# Patient Record
Sex: Female | Born: 1960 | Race: Black or African American | Hispanic: No | Marital: Married | Smoking: Never smoker
Health system: Southern US, Community
[De-identification: ages and names within clinical notes are randomized; demographics above are authoritative.]

## PROBLEM LIST (undated history)

## (undated) DIAGNOSIS — Z780 Asymptomatic menopausal state: Secondary | ICD-10-CM

## (undated) DIAGNOSIS — E559 Vitamin D deficiency, unspecified: Secondary | ICD-10-CM

## (undated) HISTORY — DX: Asymptomatic menopausal state: Z78.0

## (undated) HISTORY — PX: COLONOSCOPY: SHX174

## (undated) HISTORY — DX: Vitamin D deficiency, unspecified: E55.9

## (undated) HISTORY — PX: APPENDECTOMY: SHX54

---

## 1998-05-26 ENCOUNTER — Ambulatory Visit (HOSPITAL_COMMUNITY): Admission: RE | Admit: 1998-05-26 | Discharge: 1998-05-26 | Payer: Self-pay | Admitting: Family Medicine

## 1998-05-26 ENCOUNTER — Encounter: Payer: Self-pay | Admitting: Family Medicine

## 1998-08-13 ENCOUNTER — Encounter: Payer: Self-pay | Admitting: Family Medicine

## 1998-08-13 ENCOUNTER — Ambulatory Visit (HOSPITAL_COMMUNITY): Admission: RE | Admit: 1998-08-13 | Discharge: 1998-08-13 | Payer: Self-pay | Admitting: Family Medicine

## 2000-02-23 ENCOUNTER — Other Ambulatory Visit: Admission: RE | Admit: 2000-02-23 | Discharge: 2000-02-23 | Payer: Self-pay | Admitting: Obstetrics and Gynecology

## 2000-09-14 ENCOUNTER — Inpatient Hospital Stay (HOSPITAL_COMMUNITY): Admission: AD | Admit: 2000-09-14 | Discharge: 2000-09-19 | Payer: Self-pay | Admitting: *Deleted

## 2000-09-20 ENCOUNTER — Encounter: Admission: RE | Admit: 2000-09-20 | Discharge: 2000-11-01 | Payer: Self-pay | Admitting: Obstetrics and Gynecology

## 2000-10-28 ENCOUNTER — Other Ambulatory Visit: Admission: RE | Admit: 2000-10-28 | Discharge: 2000-10-28 | Payer: Self-pay | Admitting: Obstetrics and Gynecology

## 2003-10-22 ENCOUNTER — Other Ambulatory Visit: Admission: RE | Admit: 2003-10-22 | Discharge: 2003-10-22 | Payer: Self-pay | Admitting: Family Medicine

## 2004-11-02 ENCOUNTER — Ambulatory Visit (HOSPITAL_COMMUNITY): Admission: RE | Admit: 2004-11-02 | Discharge: 2004-11-02 | Payer: Self-pay | Admitting: Family Medicine

## 2004-11-02 ENCOUNTER — Other Ambulatory Visit: Admission: RE | Admit: 2004-11-02 | Discharge: 2004-11-02 | Payer: Self-pay | Admitting: Family Medicine

## 2008-09-02 ENCOUNTER — Encounter: Admission: RE | Admit: 2008-09-02 | Discharge: 2008-09-02 | Payer: Self-pay | Admitting: Family Medicine

## 2009-07-19 IMAGING — CR DG KNEE 1-2V*R*
2 series · 2 of 2 positions shown · non-contrast
Comparison: None

CLINICAL DATA: Knee pain, no acute trauma

RIGHT KNEE - 1-2 VIEW

[view not recorded (1 of 2)]
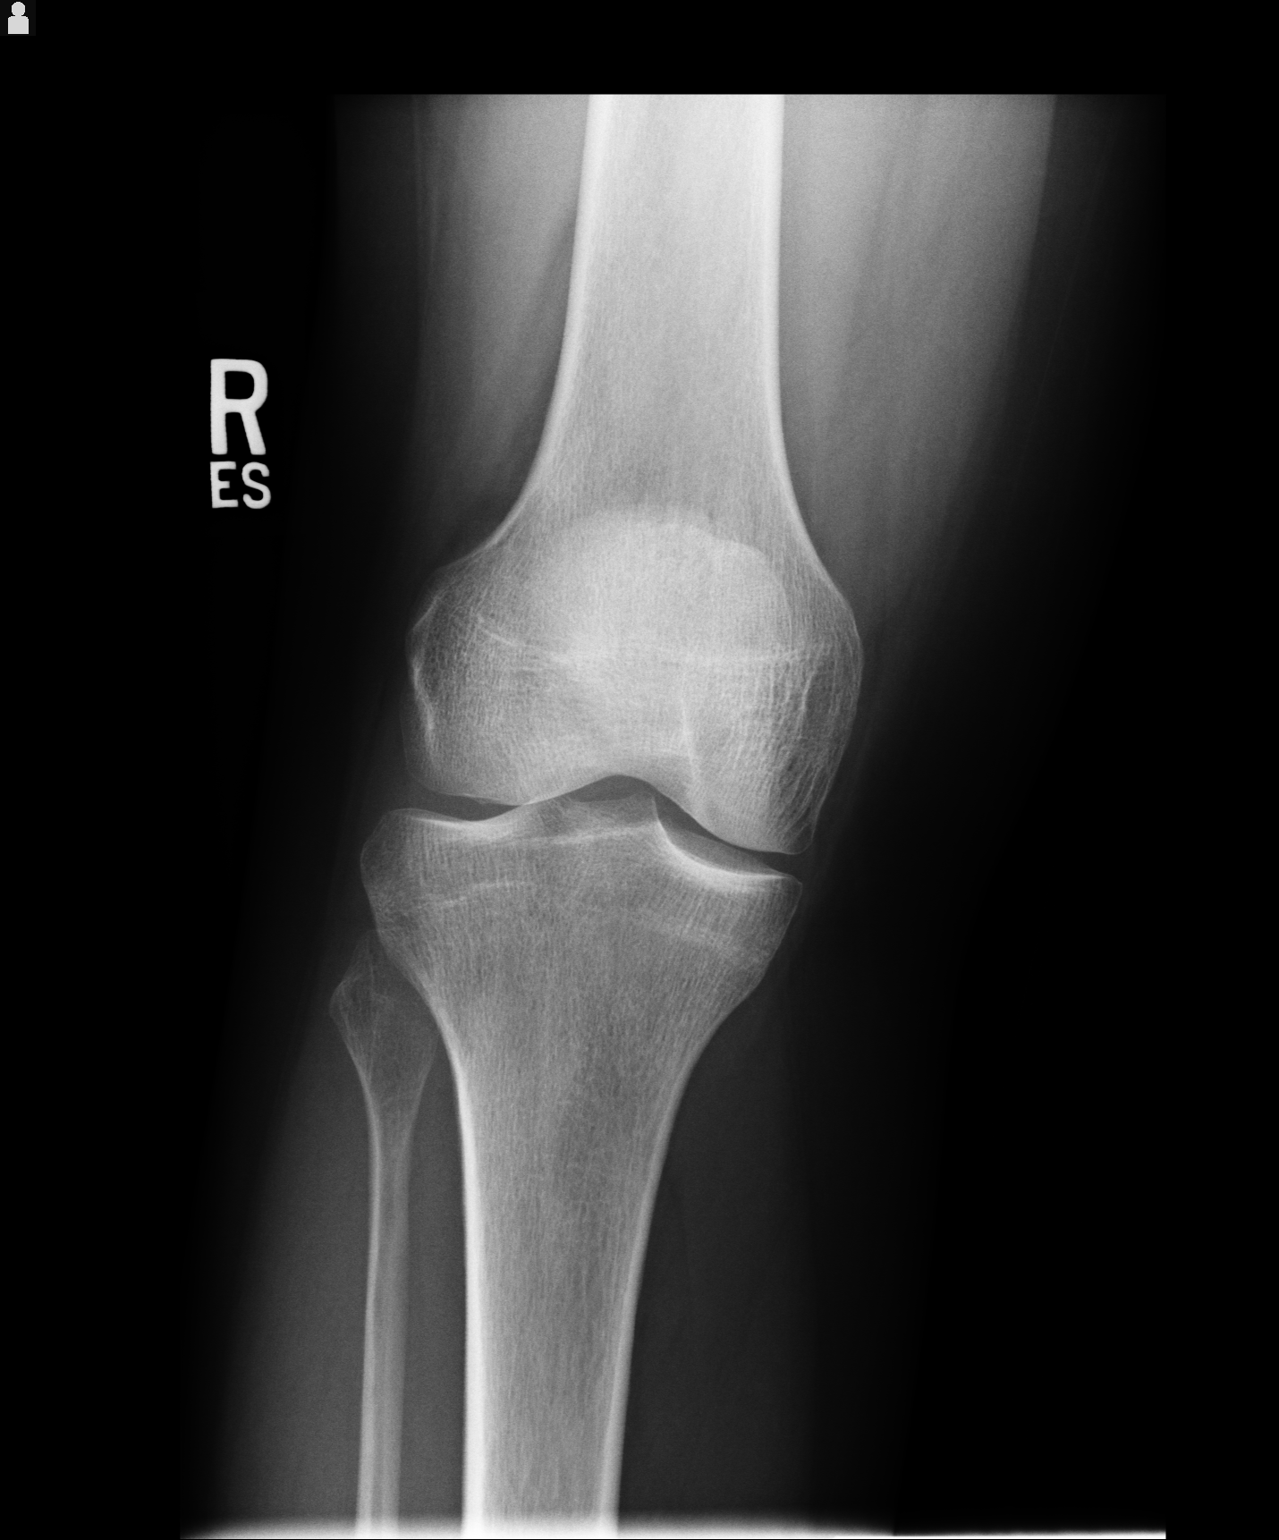

[view not recorded (2 of 2)]
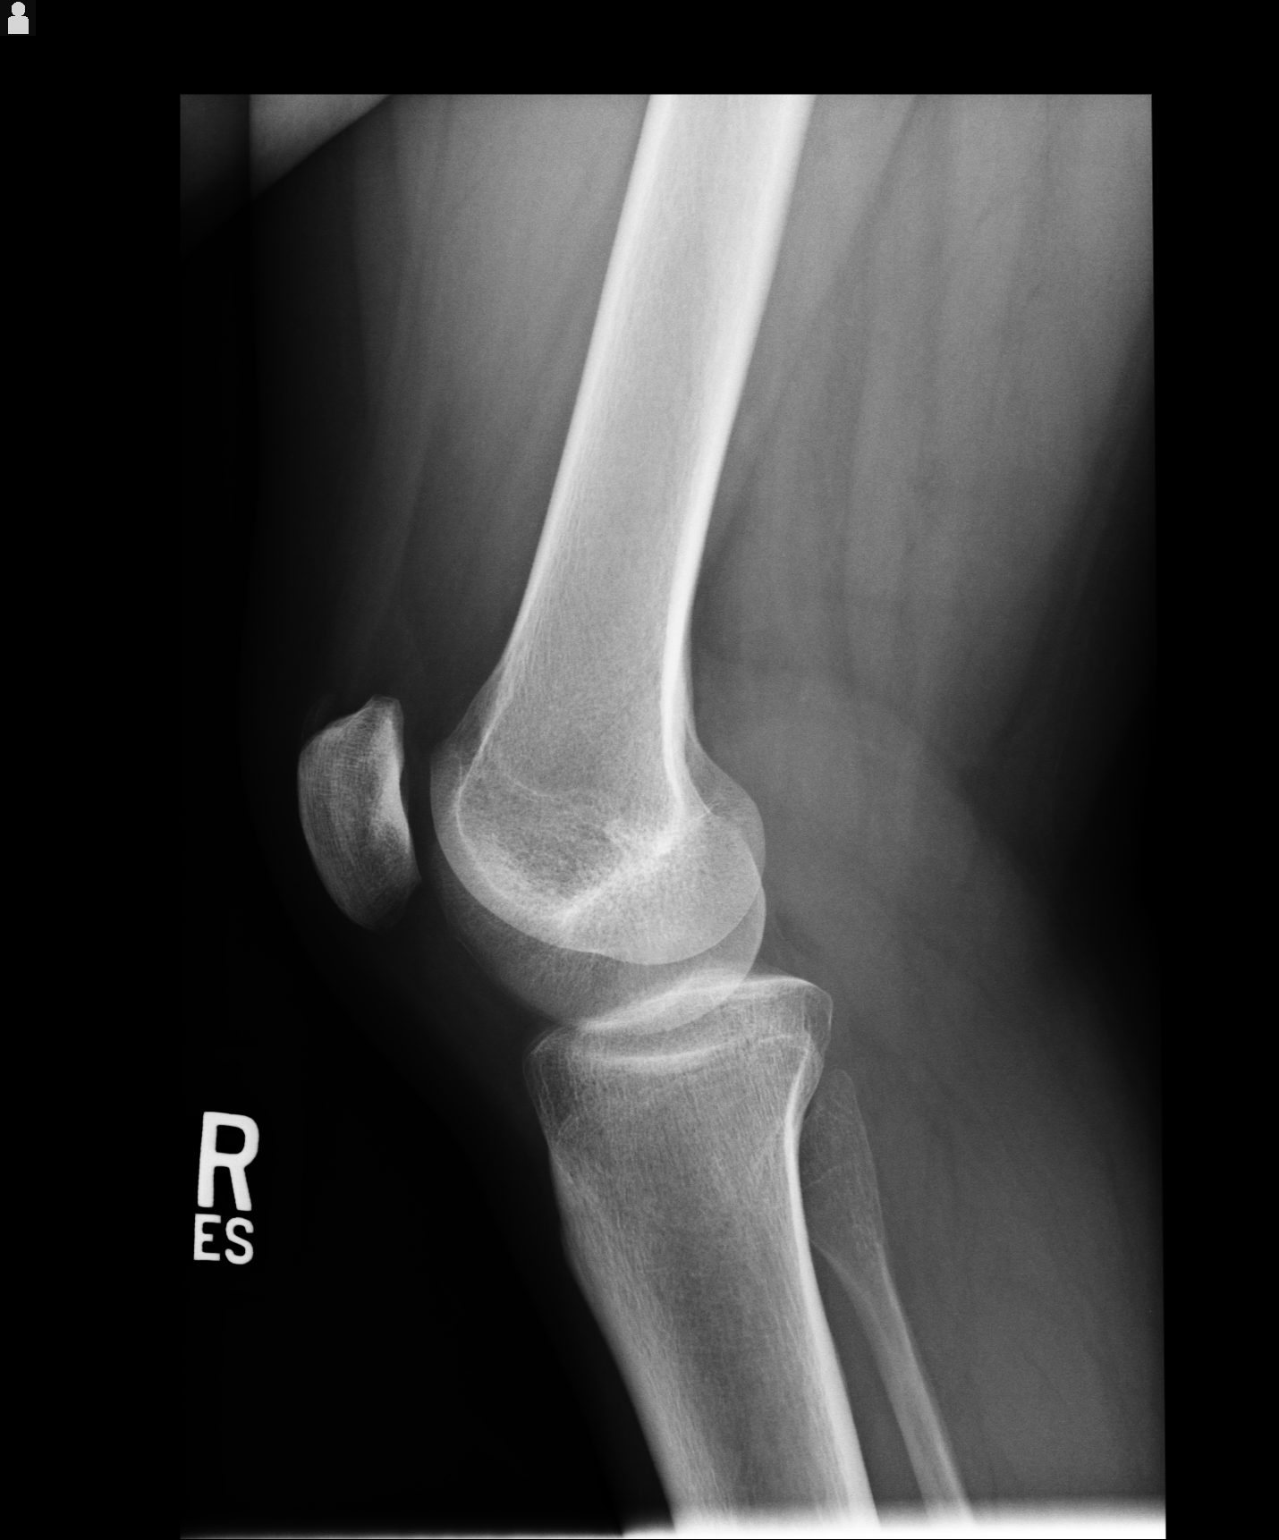

[2 of 2 positions shown; findings below may reference images not displayed]

FINDINGS: There is only minimal loss of medial joint space.  No
acute abnormality is seen and no effusion is noted.
IMPRESSION: Minimal loss of medial joint space.  No acute abnormality.

## 2009-08-29 ENCOUNTER — Other Ambulatory Visit: Admission: RE | Admit: 2009-08-29 | Discharge: 2009-08-29 | Payer: Self-pay | Admitting: Family Medicine

## 2010-11-20 NOTE — Op Note (Signed)
[facility]  Patient:    Rachel Zimmerman, Rachel Zimmerman                      MRN: [id number] Proc. Date: 09/16/00 Adm. Date:  [id number] Attending:  Donne Hazel                           Operative Report  PREOPERATIVE DIAGNOSES:       1. Intrauterine pregnancy at 40 weeks.                               2. Previous cesarean section.                               3. Failed induction.  POSTOPERATIVE DIAGNOSES:      1. Intrauterine pregnancy at 40 weeks.                               2. Previous cesarean section.                               3. Failed induction.  PROCEDURE:                    Repeat low transverse cesarean section.  SURGEON:                      Trevor Iha, M.D.  ANESTHESIA:                   Spinal.  ESTIMATED BLOOD LOSS:         800 cc.  INDICATIONS:                  The patient is a 50 year old G5, P1, A3 admitted at 39-5/7 weeks for elective induction of labor due to a history of C-section for nonreassuring fetal heart tones.  She underwent Prostaglandin induction, followed by Pitocin, followed by Prostaglandin, followed by Pitocin and, after no cervical change after two days of induction, proceeded with repeat cesarean section for failed induction.  The risks and benefits were discussed. Informed consent was obtained.  FINDINGS AT THE TIME OF SURGERY:              Viable female infant.  Apgars were 8 and 9; pH was 7.34.  Several intramural and submucosal fibroids were noted.  DESCRIPTION OF PROCEDURE:     After adequate analgesia, the patient was placed in the supine position with left lateral tilt.  She was sterilely prepped and draped.  The bladder was sterilely drained with a Foley catheter.  A Pfannenstiel skin incision was sharply incised.  The previous keloid scar was sharply removed.  The incision was taken down sharply to the fascia, which was incised transversely and extended superiorly and inferiorly down to  the bellies of the rectus muscles, which were separated sharply in the midline. The peritoneum was then entered sharply.  A bladder blade was placed.  The uterus was elevated, nicked and incised transversely.  A bladder flap was created and the bladder blade was replaced behind the bladder flap.  A low cervical myotomy incision was taken down to the amniotic sac.  Amniotomy was  performed.  It was extended laterally with the operators finger tips.  The vertex was delivered atraumatically with a vacuum extractor.  The nares and pharynx were suctioned.  The infant was then delivered and the cord clamped and cut.  The infant was then handed to the pediatricians for resuscitation. It was a viable female infant.  Apgars were 8 and 9.  Cord blood was then obtained.  The placenta was extracted manually.  The uterus was exteriorized and wiped clean with a dry lap.  The myotomy incision was closed in two layers, the first being a running locking layer of 0 Monocryl, the second being an imbricating layer with 0 Monocryl.  Good hemostasis was achieved. The uterus was placed back in the peritoneal cavity.  After a copious amount of irrigation and adequate hemostasis, the peritoneum was closed with 0 Monocryl.  The rectus muscle was plicated in the midline with 0 Monocryl.  The fascia was then closed with a single suture of 0 Panacryl in a running fashion.  After adequate hemostasis was assured and irrigation was applied, the skin was stapled and Steri-Strips were applied.  The patient tolerated the procedure well and was stable on transfer to the recovery room.  Sponge, needle and instrument counts were normal x 3.  The patient received 1 g of cefotetan after delivery of the placenta. DD:  09/16/00 TD:  09/16/00 Job: [id number]

## 2010-11-20 NOTE — Discharge Summary (Signed)
[facility]  Patient:    Rachel Zimmerman, Rachel Zimmerman                      MRN: [id number] Adm. Date:  [id number] Disc. Date: [id number] Attending:  Donne Hazel Dictator:   Danie Chandler, R.N.                           Discharge Summary  ADMISSION DIAGNOSIS:          Intrauterine pregnancy at term for elective induction of labor and vaginal birth after cesarean attempt.  DISCHARGE DIAGNOSES:          1. Intrauterine pregnancy at term for elective induction of labor and vaginal birth after cesarean attempt.                                2. Previous cesarean section and failed induction.  PROCEDURE:                    On September 16, 2000, repeat low transverse cesarean section.  REASON FOR ADMISSION:         The patient is a 50 year old gravida 5, para 1 who was admitted at 39-5/7 weeks for elective induction of labor due to a history of C-section for nonreassuring fetal heart tones. The patient underwent prostaglandin induction followed by Pitocin followed by prostaglandin followed by Pitocin and after no cervical change after two days of induction, the decision was made to proceed with repeat cesarean section for failed induction.  HOSPITAL COURSE:              The patient was taken to the operating room and underwent the above noted procedure without complications.  This was productive of a viable female infant with Apgars of 8 at one minute and 9 at five minutes and an arterial cord pH of 7.34.  Postoperatively, on day #1, the patients hemoglobin was 11.2, hematocrit 32.1 and white blood cell count 9.2. On postoperative day #2, the patient had a good return of bowel function and was tolerating a regular diet.  She was also ambulating well without difficulty and had good pain control.  She was discharged home on postoperative day #3.  CONDITION ON DISCHARGE:       Good.  DIET:                         Regular as tolerated.  ACTIVITY:                      No heavy lifting, no driving, no vaginal entry.  FOLLOW-UP:                    She is to follow up in the office in one to two weeks for incision check and she is to call for temperature greater than 100 degrees, persistent nausea or vomiting, heavy vaginal bleeding, and/or redness or drainage from the incision site.  DISCHARGE MEDICATIONS:        1. Prenatal vitamin one p.o. q.d.                               2. Tylox #25 one to two p.o. q.4-6h. p.r.n.  pain.                               3. Motrin 600 mg one every six hours as needed                                  for pain. DD:  10/04/00 TD:  10/04/00 Job: [id number]

## 2012-02-01 ENCOUNTER — Other Ambulatory Visit (HOSPITAL_COMMUNITY)
Admission: RE | Admit: 2012-02-01 | Discharge: 2012-02-01 | Disposition: A | Discharge status: Home / Self Care | Payer: 59 | Source: Ambulatory Visit | Attending: Family Medicine | Admitting: Family Medicine

## 2012-02-01 DIAGNOSIS — Z01419 Encounter for gynecological examination (general) (routine) without abnormal findings: Secondary | ICD-10-CM | POA: Insufficient documentation

## 2014-08-27 ENCOUNTER — Other Ambulatory Visit: Payer: Self-pay | Admitting: Family Medicine

## 2014-08-27 ENCOUNTER — Other Ambulatory Visit (HOSPITAL_COMMUNITY)
Admission: RE | Admit: 2014-08-27 | Discharge: 2014-08-27 | Disposition: A | Discharge status: Home / Self Care | Payer: 59 | Source: Ambulatory Visit | Attending: Family Medicine | Admitting: Family Medicine

## 2014-08-27 DIAGNOSIS — Z01419 Encounter for gynecological examination (general) (routine) without abnormal findings: Secondary | ICD-10-CM | POA: Diagnosis present

## 2014-08-28 LAB — CYTOLOGY - PAP

## 2015-03-03 ENCOUNTER — Encounter: Payer: Self-pay | Admitting: *Deleted

## 2015-03-05 ENCOUNTER — Ambulatory Visit (INDEPENDENT_AMBULATORY_CARE_PROVIDER_SITE_OTHER): Payer: 59 | Admitting: Neurology

## 2015-03-05 ENCOUNTER — Other Ambulatory Visit (INDEPENDENT_AMBULATORY_CARE_PROVIDER_SITE_OTHER): Payer: 59

## 2015-03-05 ENCOUNTER — Encounter: Payer: Self-pay | Admitting: Neurology

## 2015-03-05 VITALS — BP 124/80 | HR 62 | Ht 67.25 in | Wt 186.2 lb

## 2015-03-05 DIAGNOSIS — R202 Paresthesia of skin: Secondary | ICD-10-CM

## 2015-03-05 LAB — C-REACTIVE PROTEIN: CRP: 0.2 mg/dL — AB (ref 0.5–20.0)

## 2015-03-05 LAB — VITAMIN B12: Vitamin B-12: 600 pg/mL (ref 211–911)

## 2015-03-05 LAB — SEDIMENTATION RATE: SED RATE: 16 mm/h (ref 0–22)

## 2015-03-05 NOTE — Patient Instructions (Signed)
1.  Check blood work 2.  NCS/EMG of the right side 3.  Return to clinic after testing   [place] (EMG/NCS) INSTRUCTIONS  How to Prepare The neurologist conducting the EMG will need to know if you have certain medical conditions. Tell the neurologist and other EMG lab personnel if you: . Have a pacemaker or any other electrical medical device . Take blood-thinning medications . Have hemophilia, a blood-clotting disorder that causes prolonged bleeding Bathing Take a shower or bath shortly before your exam in order to remove oils from your skin. Don't apply lotions or creams before the exam.  What to Expect You'll likely be asked to change into a hospital gown for the procedure and lie down on an examination table. The following explanations can help you understand what will happen during the exam.  . Electrodes. The neurologist or a technician places surface electrodes at various locations on your skin depending on where you're experiencing symptoms. Or the neurologist may insert needle electrodes at different sites depending on your symptoms.  . Sensations. The electrodes will at times transmit a tiny electrical current that you may feel as a twinge or spasm. The needle electrode may cause discomfort or pain that usually ends shortly after the needle is removed. If you are concerned about discomfort or pain, you may want to talk to the neurologist about taking a short break during the exam.  . Instructions. During the needle EMG, the neurologist will assess whether there is any spontaneous electrical activity when the muscle is at rest - activity that isn't present in healthy muscle tissue - and the degree of activity when you slightly contract the muscle.  He or she will give you instructions on resting and contracting a muscle at appropriate times. Depending on what muscles and nerves the neurologist is examining, he or she may ask you to change positions  during the exam.  After your EMG You may experience some temporary, minor bruising where the needle electrode was inserted into your muscle. This bruising should fade within several days. If it persists, contact your primary care doctor.

## 2015-03-05 NOTE — Progress Notes (Signed)
Note routed

## 2015-03-05 NOTE — Progress Notes (Signed)
[facility] Clinic Note - Initial Visit   Date: 03/05/2015  Rachel Zimmerman MRN: [id number] DOB: 1961-06-25   Dear Dr. Nancy Fetter:  Thank you for your kind referral of Rachel Zimmerman for consultation of generalized numbness and tingling. Although her history is well known to you, please allow [place] to reiterate it for the purpose of our medical record. The patient was accompanied to the clinic by self.   History of Present Illness: Rachel Zimmerman is a 54 y.o. right-handed African [place] female with no prior medical history presenting for evaluation of generalized numbness/tingling.    For the past several years, she has noticed that her arms and legs fall asleep more than usual.  Her hands will fall asleep when she is talking on the phone, during sleep, and if she is laying on that side.  It quickly resolves if she repositions or tries to shake it off.  She had a fall in July 2016, she was taking a nap and woke up to go to the restroom and lost sensation of her right leg and fell.  She immediately started experiencing tingling sensation.  Her arms and legs fall asleep within about two minutes of any type of pressure, such as even resting her arm on the arm rest.    She also complains of a lot of joint pain (wrist, shoulders, knees, and hips).  She feels as if she is falling apart. She has previously seen a rheumatologist.  She complains of leg weakness and knee pain.    She feels that she has circulation problems because her legs would fall asleep even as a child.  Her PCP recommended ABIs but patient wanted to be evaluated prior to ordering studies so was referred here for further work-up.   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 Otherwise negative MR angiography of the intracranial  circulation.  1. Relatively minor changes of white matter disease in the bifrontal region of uncertain significance; considerations include early small vessel disease, complicated migraine or vasculitis.  2. Diffusion images negative for acute stroke.  3. No abnormal intracranial enhancement. No obvious cause is seen for the patient's exertional headaches.   Labs 08/27/2014:  TSH 0.49   Past Medical History  Diagnosis Date  . Vitamin D deficiency   . Menopause     Past Surgical History  Procedure Laterality Date  . Cesarean section      x 2  . Appendectomy       Medications:  Outpatient Encounter Prescriptions as of 03/05/2015  Medication Sig  . cholecalciferol (VITAMIN D) 1000 UNITS tablet Take 1,000 Units by mouth daily.  . COD LIVER OIL PO Take by mouth.  . Multiple Vitamins-Minerals (MULTIVITAMIN ADULT PO) Take by mouth.  . [DISCONTINUED] Magnesium 300 MG CAPS Take 2 capsules by mouth every evening.  . [DISCONTINUED] Pyridoxine HCl (VITAMIN B-6) 250 MG tablet Take 250 mg by mouth daily.   No facility-administered encounter medications on file as of 03/05/2015.     Allergies:  Allergies  Allergen Reactions  . Benadryl [Diphenhydramine Hcl (Sleep)]     Family History: Family History  Problem Relation Age of Onset  . Hypertension Father   . Kidney failure Father   . Diabetes Father   . Stroke Father   . Hypercholesterolemia Mother   . Myasthenia gravis Mother   .  Breast cancer Maternal Aunt     Social History: Social History  Substance Use Topics  . Smoking status: Never Smoker   . Smokeless tobacco: Never Used  . Alcohol Use: 0.0 oz/week    0 Standard drinks or equivalent per week   Social History   Social History Narrative   Married. Lives in a two story home.  2 sons.  Works as a Warden/ranger for [employer]  Education: college.    Review of Systems:  CONSTITUTIONAL: No fevers, chills, night sweats, or weight loss.   EYES: No visual changes  or eye pain ENT: No hearing changes.  No history of nose bleeds.   RESPIRATORY: No cough, wheezing and shortness of breath.   CARDIOVASCULAR: Negative for chest pain, and palpitations.   GI: Negative for abdominal discomfort, blood in stools or black stools.  No recent change in bowel habits.   GU:  No history of incontinence.   MUSCLOSKELETAL: + history of joint pain or swelling.  No myalgias.   SKIN: Negative for lesions, rash, and itching.   HEMATOLOGY/ONCOLOGY: Negative for prolonged bleeding, bruising easily, and swollen nodes.  No history of cancer.   ENDOCRINE: Negative for cold or heat intolerance, polydipsia or goiter.   PSYCH:  No depression or anxiety symptoms.   NEURO: As Above.   Vital Signs:  BP 124/80 mmHg  Pulse 62  Ht 5' 7.25" (1.708 m)  Wt 186 lb 4 oz (84.482 kg)  BMI 28.96 kg/m2  SpO2 99%   General Medical Exam:   General:  Well appearing, comfortable.   Eyes/ENT: see cranial nerve examination.   Neck: No masses appreciated.  Full range of motion without tenderness.  No carotid bruits. Respiratory:  Clear to auscultation, good air entry bilaterally.   Cardiac:  Regular rate and rhythm, no murmur.   Extremities:  No deformities, edema, or skin discoloration.  Skin:  No rashes or lesions.  Neurological Exam: MENTAL STATUS including orientation to time, place, person, recent and remote memory, attention span and concentration, language, and fund of knowledge is normal.  Speech is not dysarthric.  CRANIAL NERVES: II:  No visual field defects.  Unremarkable fundi.   III-IV-VI: Pupils equal round and reactive to light.  Normal conjugate, extra-ocular eye movements in all directions of gaze.  No nystagmus.  No ptosis.   V:  Normal facial sensation.    VII:  Normal facial symmetry and movements.  No pathologic facial reflexes.  VIII:  Normal hearing and vestibular function.   IX-X:  Normal palatal movement.   XI:  Normal shoulder shrug and head rotation.   XII:   Normal tongue strength and range of motion, no deviation or fasciculation.  MOTOR:  No atrophy, fasciculations or abnormal movements.  No pronator drift.  Tone is normal.    Right Upper Extremity:    Left Upper Extremity:    Deltoid  5/5   Deltoid  5/5   Biceps  5/5   Biceps  5/5   Triceps  5/5   Triceps  5/5   Wrist extensors  5/5   Wrist extensors  5/5   Wrist flexors  5/5   Wrist flexors  5/5   Finger extensors  5/5   Finger extensors  5/5   Finger flexors  5/5   Finger flexors  5/5   Dorsal interossei  5/5   Dorsal interossei  5/5   Abductor pollicis  5/5   Abductor pollicis  5/5   Tone (Ashworth  scale)  0  Tone (Ashworth scale)  0   Right Lower Extremity:    Left Lower Extremity:    Hip flexors  5/5   Hip flexors  5/5   Hip extensors  5/5   Hip extensors  5/5   Knee flexors  5/5   Knee flexors  5/5   Knee extensors  5/5   Knee extensors  5/5   Dorsiflexors  5/5   Dorsiflexors  5/5   Plantarflexors  5/5   Plantarflexors  5/5   Toe extensors  5/5   Toe extensors  5/5   Toe flexors  5/5   Toe flexors  5/5   Tone (Ashworth scale)  0  Tone (Ashworth scale)  0   MSRs:  Right                                                                 Left brachioradialis 2+  brachioradialis 2+  biceps 2+  biceps 2+  triceps 2+  triceps 2+  patellar 2+  patellar 2+  ankle jerk 2+  ankle jerk 2+  Hoffman no  Hoffman no  plantar response down  plantar response down   SENSORY:  Normal and symmetric perception of light touch, pinprick, vibration, and proprioception.  Romberg's sign absent.   COORDINATION/GAIT: Normal finger-to- nose-finger and heel-to-shin.  Intact rapid alternating movements bilaterally.  Able to rise from a chair without using arms.  Gait narrow based and stable. Tandem and stressed gait intact.    IMPRESSION: Mrs. Sorey is a 54 year-old female presenting for evaluation of intermittent generalized paresthesias of the arms and legs. Her exam is entirely normal and  non-focal. Nerves are not palpable on exam. 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  I will check for treatable causes of neuropathy and also proceed with NCS/EMG to help localize her symptoms.  Encouraged to her avoid hyperflexion/extension of the arm and wrist to see if this helps in the meantime.    PLAN/RECOMMENDATIONS:  1.  Check ESR, CRP, ANA, B12, copper 2.  NCS/EMG of the right arm and leg 3.  Return to clinic in 2 months.   The duration of this appointment visit was 40 minutes of face-to-face time with the patient.  Greater than 50% of this time was spent in counseling, explanation of diagnosis, planning of further management, and coordination of care.   Thank you for allowing me to participate in patient's care.  If I can answer any additional questions, I would be pleased to do so.    Sincerely,    Analisse Randle K. Posey Pronto, DO

## 2015-03-06 LAB — ANA: ANA: NEGATIVE

## 2015-03-08 LAB — COPPER, SERUM: Copper: 92 ug/dL (ref 70–175)

## 2015-03-18 ENCOUNTER — Ambulatory Visit (INDEPENDENT_AMBULATORY_CARE_PROVIDER_SITE_OTHER): Payer: 59 | Admitting: Neurology

## 2015-03-18 DIAGNOSIS — R202 Paresthesia of skin: Secondary | ICD-10-CM

## 2015-03-18 NOTE — Procedures (Signed)
[facility]  [place], [place]  [place], [address] [address] Tel: [phone] Fax:  [phone] Test Date:  03/18/2015  Patient: Rachel Zimmerman DOB: Apr 06, 1961 Physician: Narda Amber  Sex: Female Height: 5\' 7"  Ref Phys: Narda Amber, D.O.  ID#: [id number] Temp: 34.6C Technician: Jerilynn Mages. Dean   Patient Complaints: This is a 54 year old female presenting for evaluation of generalized paresthesias of her extremities.  NCV & EMG Findings: Extensive electrodiagnostic testing of the right upper and lower extremity shows:  1. Right median, ulnar, radial, and palmar sensory responses are within normal limits.  2. Right median and ulnar motor responses are within normal limits.  3. Right sural and superficial peroneal sensory responses are within normal limits.  4. Right peroneal and tibial motor responses are within normal limits.  5. There is no evidence of active or chronic motor axon loss changes affecting any of the tested muscles. Motor unit configuration and recruitment pattern is within normal limits.   Impression: This is a normal study of the right upper and lower extremities. In particular, there is no evidence of a generalized sensorimotor polyneuropathy, cervical/lumbosacral radiculopathy, or carpal tunnel syndrome affecting the right side.   ___________________________ Narda Amber, DO    Nerve Conduction Studies Anti Sensory Summary Table   Stim Site NR Peak (ms) Norm Peak (ms) P-T Amp (V) Norm P-T Amp  Right Median Anti Sensory (2nd Digit)  Wrist    2.6 <3.6 51.0 >15  Right Radial Anti Sensory (Base 1st Digit)  Wrist    2.0 <2.7 49.0 >14  Right Sup Peroneal Anti Sensory (Ant Lat Mall)  12 cm    3.5 <4.6 20.2 >4  Right Sural Anti Sensory (Lat Mall)  Calf    3.5 <4.6 15.6 >4  Right Ulnar Anti Sensory (5th Digit)  Wrist    2.7 <3.1 56.4 >10   Motor Summary Table   Stim Site NR Onset (ms) Norm Onset (ms) O-P Amp (mV) Norm O-P Amp Site1 Site2 Delta-0  (ms) Dist (cm) Vel (m/s) Norm Vel (m/s)  Right Median Motor (Abd Poll Brev)  Wrist    3.0 <4.0 8.9 >6 Elbow Wrist 4.1 25.0 61 >50  Elbow    7.1  8.4         Right Peroneal Motor (Ext Dig Brev)  Ankle    2.8 <6.0 5.6 >2.5 B Fib Ankle 7.4 35.0 47 >40  B Fib    10.2  5.6  Poplt B Fib 1.7 10.0 59 >40  Poplt    11.9  5.4         Right Tibial Motor (Abd Hall Brev)  Ankle    2.8 <6.0 6.1 >4 Knee Ankle 9.8 44.5 45 >40  Knee    12.6  4.7         Right Ulnar Motor (Abd Dig Minimi)  Wrist    2.8 <3.1 7.1 >7 B Elbow Wrist 3.8 23.0 61 >50  B Elbow    6.6  6.8  A Elbow B Elbow 1.5 10.0 67 >50  A Elbow    8.1  6.2          Comparison Summary Table   Stim Site NR Peak (ms) Norm Peak (ms) P-T Amp (V) Site1 Site2 Delta-P (ms) Norm Delta (ms)  Right Median/Ulnar Palm Comparison (Wrist - 8cm)  Median Palm    1.7 <2.2 70.4 Median Palm Ulnar Palm 0.1   Ulnar Palm    1.6 <2.2 25.0  F Wave Studies   NR F-Lat (ms) Lat Norm (ms) L-R F-Lat (ms)  Right Ulnar (Mrkrs) (Abd Dig Min)     28.03 <33    H Reflex Studies   NR H-Lat (ms) Lat Norm (ms) L-R H-Lat (ms)  Right Tibial (Gastroc)     26.67 <35    EMG   Side Muscle Ins Act Fibs Psw Fasc Number Recrt Dur Dur. Amp Amp. Poly Poly. Comment  Right AntTibialis Nml Nml Nml Nml Nml Nml Nml Nml Nml Nml Nml Nml N/A  Right 1stDorInt Nml Nml Nml Nml Nml Nml Nml Nml Nml Nml Nml Nml N/A  Right Ext Indicis Nml Nml Nml Nml Nml Nml Nml Nml Nml Nml Nml Nml N/A  Right PronatorTeres Nml Nml Nml Nml Nml Nml Nml Nml Nml Nml Nml Nml N/A  Right Biceps Nml Nml Nml Nml Nml Nml Nml Nml Nml Nml Nml Nml N/A  Right Triceps Nml Nml Nml Nml Nml Nml Nml Nml Nml Nml Nml Nml N/A  Right Deltoid Nml Nml Nml Nml Nml Nml Nml Nml Nml Nml Nml Nml N/A  Right Gastroc Nml Nml Nml Nml Nml Nml Nml Nml Nml Nml Nml Nml N/A  Right Flex Dig Long Nml Nml Nml Nml Nml Nml Nml Nml Nml Nml Nml Nml N/A  Right RectFemoris Nml Nml Nml Nml Nml Nml Nml Nml Nml Nml Nml Nml N/A  Right GluteusMed Nml  Nml Nml Nml Nml Nml Nml Nml Nml Nml Nml Nml N/A  Right BicepsFemS Nml Nml Nml Nml Nml Nml Nml Nml Nml Nml Nml Nml N/A      Waveforms:

## 2016-07-28 ENCOUNTER — Other Ambulatory Visit: Payer: Self-pay | Admitting: Obstetrics and Gynecology

## 2016-08-31 ENCOUNTER — Other Ambulatory Visit: Payer: Self-pay | Admitting: Family Medicine

## 2016-08-31 ENCOUNTER — Ambulatory Visit
Admission: RE | Admit: 2016-08-31 | Discharge: 2016-08-31 | Disposition: A | Discharge status: Home / Self Care | Payer: BLUE CROSS/BLUE SHIELD | Source: Ambulatory Visit | Attending: Family Medicine | Admitting: Family Medicine

## 2016-08-31 DIAGNOSIS — M25572 Pain in left ankle and joints of left foot: Secondary | ICD-10-CM

## 2016-09-03 ENCOUNTER — Encounter (HOSPITAL_COMMUNITY): Payer: Self-pay

## 2016-09-15 ENCOUNTER — Ambulatory Visit (HOSPITAL_COMMUNITY)
Admission: RE | Admit: 2016-09-15 | Discharge: 2016-09-15 | Disposition: A | Discharge status: Home / Self Care | Payer: BLUE CROSS/BLUE SHIELD | Source: Ambulatory Visit | Attending: Obstetrics and Gynecology | Admitting: Obstetrics and Gynecology

## 2016-09-15 ENCOUNTER — Ambulatory Visit (HOSPITAL_COMMUNITY): Payer: BLUE CROSS/BLUE SHIELD | Admitting: Certified Registered Nurse Anesthetist

## 2016-09-15 ENCOUNTER — Encounter (HOSPITAL_COMMUNITY)
Admission: RE | Disposition: A | Discharge status: Home / Self Care | Payer: Self-pay | Source: Ambulatory Visit | Attending: Obstetrics and Gynecology

## 2016-09-15 ENCOUNTER — Ambulatory Visit (HOSPITAL_COMMUNITY): Payer: BLUE CROSS/BLUE SHIELD

## 2016-09-15 ENCOUNTER — Encounter (HOSPITAL_COMMUNITY): Payer: Self-pay | Admitting: Certified Registered Nurse Anesthetist

## 2016-09-15 DIAGNOSIS — E559 Vitamin D deficiency, unspecified: Secondary | ICD-10-CM | POA: Insufficient documentation

## 2016-09-15 DIAGNOSIS — N950 Postmenopausal bleeding: Secondary | ICD-10-CM | POA: Diagnosis not present

## 2016-09-15 DIAGNOSIS — R58 Hemorrhage, not elsewhere classified: Secondary | ICD-10-CM

## 2016-09-15 DIAGNOSIS — Z79899 Other long term (current) drug therapy: Secondary | ICD-10-CM | POA: Diagnosis not present

## 2016-09-15 DIAGNOSIS — D250 Submucous leiomyoma of uterus: Secondary | ICD-10-CM | POA: Diagnosis not present

## 2016-09-15 HISTORY — PX: OPERATIVE ULTRASOUND: SHX5996

## 2016-09-15 HISTORY — PX: DILATATION & CURETTAGE/HYSTEROSCOPY WITH MYOSURE: SHX6511

## 2016-09-15 LAB — CBC
HCT: 34.1 % — ABNORMAL LOW (ref 36.0–46.0)
HEMOGLOBIN: 11.5 g/dL — AB (ref 12.0–15.0)
MCH: 30.8 pg (ref 26.0–34.0)
MCHC: 33.7 g/dL (ref 30.0–36.0)
MCV: 91.4 fL (ref 78.0–100.0)
Platelets: 253 10*3/uL (ref 150–400)
RBC: 3.73 MIL/uL — AB (ref 3.87–5.11)
RDW: 14 % (ref 11.5–15.5)
WBC: 5.2 10*3/uL (ref 4.0–10.5)

## 2016-09-15 LAB — TYPE AND SCREEN
ABO/RH(D): O POS
ANTIBODY SCREEN: NEGATIVE

## 2016-09-15 LAB — ABO/RH: ABO/RH(D): O POS

## 2016-09-15 SURGERY — DILATATION & CURETTAGE/HYSTEROSCOPY WITH MYOSURE
Anesthesia: General | Site: Uterus

## 2016-09-15 MED ORDER — MIDAZOLAM HCL 2 MG/2ML IJ SOLN
INTRAMUSCULAR | Status: DC | PRN
  Administered 2016-09-15: 2 mg via INTRAVENOUS

## 2016-09-15 MED ORDER — OXYCODONE HCL 5 MG/5ML PO SOLN: 5.0000 mg | Freq: Once | ORAL | Status: DC | PRN

## 2016-09-15 MED ORDER — LIDOCAINE HCL 2 % IJ SOLN
INTRAMUSCULAR | Status: AC
  Filled 2016-09-15: qty 20

## 2016-09-15 MED ORDER — ONDANSETRON HCL 4 MG/2ML IJ SOLN
INTRAMUSCULAR | Status: DC | PRN
  Administered 2016-09-15: 4 mg via INTRAVENOUS

## 2016-09-15 MED ORDER — METHYLERGONOVINE MALEATE 0.2 MG PO TABS: 0.2000 mg | ORAL_TABLET | Freq: Four times a day (QID) | ORAL | 0 refills | Status: AC

## 2016-09-15 MED ORDER — PROPOFOL 10 MG/ML IV BOLUS
INTRAVENOUS | Status: AC
  Filled 2016-09-15: qty 20

## 2016-09-15 MED ORDER — FENTANYL CITRATE (PF) 100 MCG/2ML IJ SOLN
INTRAMUSCULAR | Status: AC
  Filled 2016-09-15: qty 2

## 2016-09-15 MED ORDER — DEXAMETHASONE SODIUM PHOSPHATE 10 MG/ML IJ SOLN
INTRAMUSCULAR | Status: DC | PRN
  Administered 2016-09-15: 4 mg via INTRAVENOUS

## 2016-09-15 MED ORDER — METOCLOPRAMIDE HCL 5 MG/ML IJ SOLN
INTRAMUSCULAR | Status: AC
  Filled 2016-09-15: qty 2

## 2016-09-15 MED ORDER — MIDAZOLAM HCL 2 MG/2ML IJ SOLN
INTRAMUSCULAR | Status: AC
  Filled 2016-09-15: qty 2

## 2016-09-15 MED ORDER — KETOROLAC TROMETHAMINE 30 MG/ML IJ SOLN
INTRAMUSCULAR | Status: AC
  Filled 2016-09-15: qty 1

## 2016-09-15 MED ORDER — SODIUM CHLORIDE 0.9 % IR SOLN
Status: DC | PRN
  Administered 2016-09-15 (×3): 3000 mL

## 2016-09-15 MED ORDER — METHYLERGONOVINE MALEATE 0.2 MG/ML IJ SOLN
0.2000 mg | Freq: Once | INTRAMUSCULAR | Status: AC
  Administered 2016-09-15: 0.2 mg via INTRAMUSCULAR

## 2016-09-15 MED ORDER — LIDOCAINE HCL 2 % IJ SOLN
INTRAMUSCULAR | Status: DC | PRN
  Administered 2016-09-15: 10 mL

## 2016-09-15 MED ORDER — ONDANSETRON HCL 4 MG/2ML IJ SOLN: 4.0000 mg | Freq: Four times a day (QID) | INTRAMUSCULAR | Status: DC | PRN

## 2016-09-15 MED ORDER — DEXAMETHASONE SODIUM PHOSPHATE 4 MG/ML IJ SOLN
INTRAMUSCULAR | Status: AC
  Filled 2016-09-15: qty 1

## 2016-09-15 MED ORDER — ONDANSETRON HCL 4 MG/2ML IJ SOLN
INTRAMUSCULAR | Status: AC
  Filled 2016-09-15: qty 2

## 2016-09-15 MED ORDER — FENTANYL CITRATE (PF) 100 MCG/2ML IJ SOLN: 25.0000 ug | INTRAMUSCULAR | Status: DC | PRN

## 2016-09-15 MED ORDER — PROPOFOL 10 MG/ML IV BOLUS
INTRAVENOUS | Status: DC | PRN
  Administered 2016-09-15: 160 mg via INTRAVENOUS

## 2016-09-15 MED ORDER — SCOPOLAMINE 1 MG/3DAYS TD PT72: 1.0000 | MEDICATED_PATCH | Freq: Once | TRANSDERMAL | Status: DC

## 2016-09-15 MED ORDER — OXYCODONE HCL 5 MG PO TABS: 5.0000 mg | ORAL_TABLET | Freq: Once | ORAL | Status: DC | PRN

## 2016-09-15 MED ORDER — METOCLOPRAMIDE HCL 5 MG/ML IJ SOLN
10.0000 mg | Freq: Once | INTRAMUSCULAR | Status: AC
  Administered 2016-09-15: 10 mg via INTRAVENOUS

## 2016-09-15 MED ORDER — LACTATED RINGERS IV SOLN: INTRAVENOUS | Status: DC

## 2016-09-15 MED ORDER — IBUPROFEN 600 MG PO TABS: 600.0000 mg | ORAL_TABLET | Freq: Four times a day (QID) | ORAL | 0 refills | Status: DC | PRN

## 2016-09-15 MED ORDER — SCOPOLAMINE 1 MG/3DAYS TD PT72
1.0000 | MEDICATED_PATCH | Freq: Once | TRANSDERMAL | Status: DC
  Administered 2016-09-15: 1.5 mg via TRANSDERMAL

## 2016-09-15 MED ORDER — CEFAZOLIN SODIUM-DEXTROSE 2-4 GM/100ML-% IV SOLN
2.0000 g | INTRAVENOUS | Status: AC
  Administered 2016-09-15: 2 g via INTRAVENOUS

## 2016-09-15 MED ORDER — KETOROLAC TROMETHAMINE 30 MG/ML IJ SOLN
INTRAMUSCULAR | Status: DC | PRN
  Administered 2016-09-15: 30 mg via INTRAVENOUS

## 2016-09-15 MED ORDER — LIDOCAINE HCL (CARDIAC) 20 MG/ML IV SOLN
INTRAVENOUS | Status: DC | PRN
  Administered 2016-09-15: 40 mg via INTRAVENOUS

## 2016-09-15 MED ORDER — METHYLERGONOVINE MALEATE 0.2 MG/ML IJ SOLN
INTRAMUSCULAR | Status: AC
  Administered 2016-09-15: 0.2 mg via INTRAMUSCULAR
  Filled 2016-09-15: qty 1

## 2016-09-15 MED ORDER — OXYCODONE-ACETAMINOPHEN 5-325 MG PO TABS: 2.0000 | ORAL_TABLET | ORAL | 0 refills | Status: DC | PRN

## 2016-09-15 MED ORDER — SCOPOLAMINE 1 MG/3DAYS TD PT72
MEDICATED_PATCH | TRANSDERMAL | Status: AC
  Administered 2016-09-15: 1.5 mg via TRANSDERMAL
  Filled 2016-09-15: qty 1

## 2016-09-15 MED ORDER — LACTATED RINGERS IV SOLN
INTRAVENOUS | Status: DC
  Administered 2016-09-15 (×2): via INTRAVENOUS

## 2016-09-15 MED ORDER — FENTANYL CITRATE (PF) 100 MCG/2ML IJ SOLN
INTRAMUSCULAR | Status: DC | PRN
  Administered 2016-09-15 (×2): 25 ug via INTRAVENOUS
  Administered 2016-09-15: 50 ug via INTRAVENOUS

## 2016-09-15 SURGICAL SUPPLY — 21 items
CANISTER SUCT 3000ML (MISCELLANEOUS) ×4 IMPLANT
CATH ROBINSON RED A/P 16FR (CATHETERS) ×4 IMPLANT
CLOTH BEACON ORANGE TIMEOUT ST (SAFETY) ×4 IMPLANT
CONTAINER PREFILL 10% NBF 60ML (FORM) ×8 IMPLANT
DEVICE MYOSURE LITE (MISCELLANEOUS) ×2 IMPLANT
DEVICE MYOSURE REACH (MISCELLANEOUS) IMPLANT
DILATOR CANAL MILEX (MISCELLANEOUS) ×2 IMPLANT
FILTER ARTHROSCOPY CONVERTOR (FILTER) ×4 IMPLANT
GLOVE BIO SURGEON STRL SZ7 (GLOVE) ×4 IMPLANT
GLOVE BIOGEL PI IND STRL 7.0 (GLOVE) ×4 IMPLANT
GLOVE BIOGEL PI INDICATOR 7.0 (GLOVE) ×4
GOWN STRL REUS W/TWL LRG LVL3 (GOWN DISPOSABLE) ×8 IMPLANT
MYOSURE XL FIBROID REM (MISCELLANEOUS) ×4
PACK VAGINAL MINOR WOMEN LF (CUSTOM PROCEDURE TRAY) ×4 IMPLANT
PAD OB MATERNITY 4.3X12.25 (PERSONAL CARE ITEMS) ×4 IMPLANT
SEAL ROD LENS SCOPE MYOSURE (ABLATOR) ×4 IMPLANT
SYSTEM TISS REMOVAL MYSR XL RM (MISCELLANEOUS) IMPLANT
TOWEL OR 17X24 6PK STRL BLUE (TOWEL DISPOSABLE) ×8 IMPLANT
TUBING AQUILEX INFLOW (TUBING) ×4 IMPLANT
TUBING AQUILEX OUTFLOW (TUBING) ×4 IMPLANT
WATER STERILE IRR 1000ML POUR (IV SOLUTION) ×4 IMPLANT

## 2016-09-15 NOTE — Brief Op Note (Signed)
09/15/2016  2:02 PM  PATIENT:  Ricci Barker  56 y.o. female  PRE-OPERATIVE DIAGNOSIS:  N95.0 PMB  POST-OPERATIVE DIAGNOSIS:  PMB, Submucosal fibroid  PROCEDURE:  Procedure(s) with comments: [place] (N/A) - Ultrasound Guidance Myosure - Fibroids OPERATIVE ULTRASOUND (N/A)  SURGEON:  Surgeon(s) and Role:    * Thurnell Lose, MD - Primary  PHYSICIAN ASSISTANT: None  ASSISTANTS: none   ANESTHESIA:   local and general  EBL:  Total I/O In: 700 [I.V.:700] Out: 110 [Urine:100; Blood:10] Deficit 1275  BLOOD ADMINISTERED:none  DRAINS: none   LOCAL MEDICATIONS USED: 2% LIDOCAINE  and Amount: 10 ml  SPECIMEN:  Source of Specimen:  Endometrial currettings, Fragments of fibroid  DISPOSITION OF SPECIMEN:  PATHOLOGY  COUNTS:  YES  TOURNIQUET:  * No tourniquets in log *  DICTATION: .Other Dictation: Dictation Number [id number]  PLAN OF CARE: Discharge to home after PACU  PATIENT DISPOSITION:  PACU - hemodynamically stable.   Delay start of Pharmacological VTE agent (>24hrs) due to surgical blood loss or risk of bleeding: not applicable

## 2016-09-15 NOTE — H&P (Signed)
History of Present Illness  General:  Pt presents for preop for Hyst/D&C on 09/15/16 due to postmenopausal bleeding. Pt started taking Provera, bleeding resolved. 3-4 days later she had cramping and had heavy bleeding. Had to use super tampons, soaked through clothes and car seat. Pt called and Provera was increased. Pt could not get rx filled due to recent refill. Pt took 30 mg last night but felt it is working.   Current Medications  Taking   Provera(Medroxyprogesterone) 10 MG Tablet 1 tablet with food Orally Once a day   Medication List reviewed and reconciled with the patient    Past Medical History  vitamin D deficiency.   Menopause.   Late Menarche (age 40).           Surgical History  C section x 2   appendectomy 1984   Family History  Father: deceased, hypertension, kidney failure, diabetes, stroke (23), diagnosed with Hypertension  Mother: deceased, hypercholesterolemia, myasthenia gravis, gallbladder cancer  Paternal [place] Father: deceased  Paternal Grand Mother: deceased, diabetes, hypertension  Maternal Grand Father: deceased  Maternal Grand Mother: deceased  Paternal aunt: breast cancer x 2 (35s)  No Family History of Colon Cancer, Polyps, or Liver Disease.   Social History  General:  Tobacco use  cigarettes: Never smoked Tobacco history last updated 09/07/2016 Alcohol: yes, rare- wine.  no Recreational drug use, denies.  Exercise: 3 times weekly- yoga.  Marital Status: married.  Children: SUN,VYVYAN 03/05/2016 10:51:56 AM > 2 sons (Justin-20, Jayden-15).  OCCUPATION: Warden/ranger.  Religion: [employer].  Seat belt use: yes.    Gyn History  Sexual activity currently sexually active.  Periods : postmenopausal 2015.  LMP off and on since October 2017.  Denies H/O Birth control.  Last pap smear date 08/27/14, negative.  Last mammogram date 2016.  Denies H/O Abnormal pap smear.  Denies H/O STD.  Menarche 55.    OB History  Number of pregnancies  2.  Pregnancy # 1 live birth, C-section delivery, boy.  Pregnancy # 2 live birth, C-section, boy.    Allergies  Benadryl: itching/rash   Hospitalization/Major Diagnostic Procedure  childbirth x 2    Review of Systems  Denies fever/chills, chest pain, SOB, headaches, numbness/tingling. No h/o complication with anesthesia, bleeding disorders or blood clots.   Vital Signs  Wt 194, Ht 67, BMI 30.38, Pulse sitting 81, BP sitting 110/69.   Physical Examination  GENERAL:  Patient appears alert and oriented.  General Appearance: well-appearing, well-developed, no acute distress.  Speech: clear.  LUNGS:  Auscultation: no wheezing/rhonchi/rales. CTA bilaterally.  HEART:  Heart sounds: normal. RRR. no murmur.  ABDOMEN:  General: soft nontender, nondistended, no masses.  FEMALE GENITOURINARY:  Pelvic Not examined.  EXTREMITIES:  General: No edema or calf tenderness.     Assessments   1. Pre-operative clearance - Z01.818 (Primary)   2. Postmenopausal bleeding - N95.0   Treatment  1. Pre-operative clearance  Notes: Pt counseled on R/B/A. Risk of uterine perforation.    2. Postmenopausal bleeding  Continue Provera Tablet, 10 MG, 1 tablet with food, Orally, Once a day LAB: HGB/HCT Notes: Pt's bleeding is heavy for PMB. Continue Provera until surgery.    Procedures  Venipuncture:  Venipuncture: McMasters,Brittini 09/07/2016 12:14:55 PM > , performed in right arm.       Labs   Lab: HGB/HCT 11.7/35.5  HCT 35.5 L 37.0-47.0 - %  HGB 11.7 L 12.0-16.0 - g/dL   Emilly Lavey B 09/08/2016 05:12:55 PM > Mild anemia. Take MVI with  iron once daily. Allman,Michelle 09/09/2016 03:33:43 PM > Pt informed.

## 2016-09-15 NOTE — Interval H&P Note (Signed)
History and Physical Interval Note:  09/15/2016 12:19 PM  Rachel Zimmerman  has presented today for surgery, with the diagnosis of N95.0 PMB  The various methods of treatment have been discussed with the patient and family. After consideration of risks, benefits and other options for treatment, the patient has consented to  Procedure(s) with comments: [place] (N/A) - Ultrasound Guidance Myosure - Fibroids OPERATIVE ULTRASOUND (N/A) as a surgical intervention .  The patient's history has been reviewed, patient examined, no change in status, stable for surgery.  I have reviewed the patient's chart and labs.  Questions were answered to the patient's satisfaction.     Simona Huh, Laraine Samet

## 2016-09-15 NOTE — Anesthesia Preprocedure Evaluation (Signed)
Anesthesia Evaluation  Patient identified by MRN, date of birth, ID band Patient awake    Reviewed: Allergy & Precautions, NPO status , Patient's Chart, lab work & pertinent test results  Airway Mallampati: II   Neck ROM: full    Dental   Pulmonary neg pulmonary ROS,    breath sounds clear to auscultation       Cardiovascular negative cardio ROS   Rhythm:regular Rate:Normal     Neuro/Psych    GI/Hepatic   Endo/Other    Renal/GU      Musculoskeletal   Abdominal   Peds  Hematology   Anesthesia Other Findings   Reproductive/Obstetrics Post-menopausal bleeding                             Anesthesia Physical Anesthesia Plan  ASA: II  Anesthesia Plan: General   Post-op Pain Management:    Induction: Intravenous  Airway Management Planned: LMA  Additional Equipment:   Intra-op Plan:   Post-operative Plan:   Informed Consent: I have reviewed the patients History and Physical, chart, labs and discussed the procedure including the risks, benefits and alternatives for the proposed anesthesia with the patient or authorized representative who has indicated his/her understanding and acceptance.     Plan Discussed with: CRNA, Anesthesiologist and Surgeon  Anesthesia Plan Comments:         Anesthesia Quick Evaluation

## 2016-09-15 NOTE — Transfer of Care (Signed)
Immediate Anesthesia Transfer of Care Note  Patient: Rachel Zimmerman  Procedure(s) Performed: Procedure(s) with comments: DILATATION & CURETTAGE/HYSTEROSCOPIC MYOMECTOMY  WITH MYOSURE (N/A) - Ultrasound Guidance Myosure - Fibroids OPERATIVE ULTRASOUND (N/A)  Patient Location: PACU  Anesthesia Type:General  Level of Consciousness: awake, alert  and sedated  Airway & Oxygen Therapy: Patient Spontanous Breathing and Patient connected to nasal cannula oxygen  Post-op Assessment: Report given to RN and Post -op Vital signs reviewed and stable  Post vital signs: Reviewed and stable  Last Vitals:  Vitals:   09/15/16 1124  BP: 104/72  Pulse: 63  Resp: 18  Temp: 36.9 C    Last Pain:  Vitals:   09/15/16 1124  TempSrc: Oral  PainSc: 0-No pain      Patients Stated Pain Goal: 2 (14/48/18 5631)  Complications: No apparent anesthesia complications

## 2016-09-15 NOTE — Anesthesia Postprocedure Evaluation (Signed)
Anesthesia Post Note  Patient: Rachel Zimmerman  Procedure(s) Performed: Procedure(s) (LRB): DILATATION & CURETTAGE/HYSTEROSCOPIC MYOMECTOMY  WITH MYOSURE (N/A) OPERATIVE ULTRASOUND (N/A)  Patient location during evaluation: PACU Anesthesia Type: General Level of consciousness: awake and alert and oriented Pain management: pain level controlled Vital Signs Assessment: post-procedure vital signs reviewed and stable Respiratory status: spontaneous breathing, nonlabored ventilation and respiratory function stable Cardiovascular status: blood pressure returned to baseline and stable Postop Assessment: no signs of nausea or vomiting Anesthetic complications: no        Last Vitals:  Vitals:   09/15/16 1445 09/15/16 1500  BP:  132/81  Pulse: 78 71  Resp: 18 13  Temp:      Last Pain:  Vitals:   09/15/16 1124  TempSrc: Oral  PainSc: 0-No pain   Pain Goal: Patients Stated Pain Goal: 2 (09/15/16 1124)               Jerrell Hart A.

## 2016-09-15 NOTE — Discharge Instructions (Addendum)
Myomectomy, Care After Refer to this sheet in the next few weeks. These instructions provide you with information on caring for yourself after your procedure. Your health care provider may also give you more specific instructions. Your treatment has been planned according to current medical practices, but problems sometimes occur. Call your health care provider if you have any problems or questions after your procedure. What can I expect after the procedure? After your procedure, it is typical to have the following:  Pain in your abdomen, especially at any incision sites. You will be given pain medicine to control the pain.  Tiredness. This is a normal part of the recovery process. Your energy level will return to normal over the next several weeks.  Constipation.  Vaginal bleeding. This is normal and should stop after 1-2 weeks. Follow these instructions at home:  Only take over-the-counter or prescription medicines as directed by your health care provider. Avoid aspirin because it can cause bleeding.  Do not douche, use tampons, or have sexual intercourse until given permission by your health care provider.  Remove or change any bandages (dressings) as directed by your health care provider.  Take showers instead of baths as directed by your health care provider.  You will probably be able to go back to your normal routine after a few days. Do not do anything that requires extra effort until your health care provider says it is okay. Do not lift anything heavier than 15 pounds (6.8 kg) until your health care provider approves.  Walk daily but take frequent rest breaks if you tire easily.  Continue to practice deep breathing and coughing. If it hurts to cough, try holding a pillow against your belly as you cough.  If you become constipated, you may:  Use a mild laxative if your health care provider approves.  Add more fruit and bran to your diet.  Drink enough fluids to keep your  urine clear or pale yellow.  Take your temperature twice a day and write it down.  Do not drink alcohol.  Do not drive until your health care provider approves.  Have someone help you at home for 1 week or until you can do your own household activities.  Follow up with your health care provider as directed. Contact a health care provider if:  You have a fever.  You have increasing abdominal pain that is not relieved with medicine.  You have nausea, vomiting, or diarrhea.  You have pain when you urinate, or you have blood in your urine.  You have a rash on your body.  You have pain or redness where your IV access tube was inserted.  You have redness, swelling, or any kind of drainage from an incision. Get help right away if:  You have weakness or lightheadedness.  You have pain, swelling, or redness in your legs.  You have chest pain.  You faint.  You have shortness of breath.  You have heavy vaginal bleeding.  Your incision is opening up. This information is not intended to replace advice given to you by your health care provider. Make sure you discuss any questions you have with your health care provider. Document Released: 11/11/2010 Document Revised: 11/27/2015 Document Reviewed: 01/31/2013 Elsevier Interactive Patient Education  2017 [place] INSTRUCTIONS: HYSTEROSCOPY  The following instructions have been prepared to help you care for yourself upon your return home.  May Remove Scop patch on or before 09/18/16  May take Ibuprofen after 7:30pm today.  May take stool softner while taking narcotic pain medication to prevent constipation.  Drink plenty of water.  Personal hygiene:  Use sanitary pads for vaginal drainage, not tampons.  Shower the day after your procedure.  NO tub baths, pools or Jacuzzis for 2-3 weeks.  Wipe front to back after using the bathroom.  Activity and limitations:  Do NOT drive or operate any equipment for  24 hours. The effects of anesthesia are still present and drowsiness may result.  Do NOT rest in bed all day.  Walking is encouraged.  Walk up and down stairs slowly.  You may resume your normal activity in one to two days or as indicated by your physician. Sexual activity: NO intercourse for at least 2 weeks after the procedure, or as indicated by your Doctor.  Diet: Eat a light meal as desired this evening. You may resume your usual diet tomorrow.  Return to Work: You may resume your work activities in one to two days or as indicated by Marine scientist.  What to expect after your surgery: Expect to have vaginal bleeding/discharge for 2-3 days and spotting for up to 10 days. It is not unusual to have soreness for up to 1-2 weeks. You may have a slight burning sensation when you urinate for the first day. Mild cramps may continue for a couple of days. You may have a regular period in 2-6 weeks.  Call your doctor for any of the following:  Excessive vaginal bleeding or clotting, saturating and changing one pad every hour.  Inability to urinate 6 hours after discharge from hospital.  Pain not relieved by pain medication.  Fever of 100.4 F or greater.  Unusual vaginal discharge or odor.  Return to office _________________Call for an appointment ___________________ Patients signature: ______________________ Nurses signature ________________________  Post Anesthesia Care Unit [phone]    Post Anesthesia Home Care Instructions  Activity: Get plenty of rest for the remainder of the day. A responsible adult should stay with you for 24 hours following the procedure.  For the next 24 hours, DO NOT: -Drive a car -Paediatric nurse -Drink alcoholic beverages -Take any medication unless instructed by your physician -Make any legal decisions or sign important papers.  Meals: Start with liquid foods such as gelatin or soup. Progress to regular foods as tolerated. Avoid  greasy, spicy, heavy foods. If nausea and/or vomiting occur, drink only clear liquids until the nausea and/or vomiting subsides. Call your physician if vomiting continues.  Special Instructions/Symptoms: Your throat may feel dry or sore from the anesthesia or the breathing tube placed in your throat during surgery. If this causes discomfort, gargle with warm salt water. The discomfort should disappear within 24 hours.  If you had a scopolamine patch placed behind your ear for the management of post- operative nausea and/or vomiting:  1. The medication in the patch is effective for 72 hours, after which it should be removed.  Wrap patch in a tissue and discard in the trash. Wash hands thoroughly with soap and water. 2. You may remove the patch earlier than 72 hours if you experience unpleasant side effects which may include dry mouth, dizziness or visual disturbances. 3. Avoid touching the patch. Wash your hands with soap and water after contact with the patch.

## 2016-09-15 NOTE — Anesthesia Procedure Notes (Signed)
Procedure Name: LMA Insertion Date/Time: 09/15/2016 12:39 PM Performed by: Bufford Spikes Pre-anesthesia Checklist: Patient identified, Emergency Drugs available, Suction available and Patient being monitored Patient Re-evaluated:Patient Re-evaluated prior to inductionOxygen Delivery Method: Circle system utilized Preoxygenation: Pre-oxygenation with 100% oxygen Intubation Type: IV induction Ventilation: Mask ventilation without difficulty LMA: LMA inserted LMA Size: 4.0 Number of attempts: 1 Airway Equipment and Method: Bite block Placement Confirmation: positive ETCO2 Tube secured with: Tape Dental Injury: Teeth and Oropharynx as per pre-operative assessment

## 2016-09-16 ENCOUNTER — Encounter (HOSPITAL_COMMUNITY): Payer: Self-pay | Admitting: Obstetrics and Gynecology

## 2016-09-17 NOTE — Op Note (Signed)
NAME:  ,                                 ACCOUNT NO.:  MEDICAL RECORD NO.:  [id number]  LOCATION:                                 FACILITY:  PHYSICIAN:  Jola Schmidt, MD        DATE OF BIRTH:  DATE OF PROCEDURE:  09/15/2016 DATE OF DISCHARGE:                              OPERATIVE REPORT   PREOPERATIVE DIAGNOSIS:  Postmenopausal bleeding.  POSTOPERATIVE DIAGNOSES:  Postmenopausal bleeding, submucosal fibroid.  PROCEDURE:  Hysteroscopy, D and C, and hysteroscopic myomectomy under ultrasound guidance.  SURGEON:  Raul Del. Simona Huh, MD.  ASSISTANT:  None.  ANESTHESIA:  Local and general.  ESTIMATED BLOOD LOSS:  10.  URINE:  100 prior to the procedure.  IV FLUIDS:  Approximately 700.  DEFICIT:  5956.  BLOOD ADMINISTERED:  None.  DRAINS:  None.  LOCAL:  2% lidocaine, amount 10 mL.  SPECIMEN:  Endometrial curettings and fragments of fibroid.  DISPOSITION OF SPECIMEN:  To pathology.  PATIENT DISPOSITION:  To PACU, hemodynamically stable.  COMPLICATIONS:  None.  FINDINGS:  Large pedunculated submucosal fibroid.  Endometrium appears inflamed, but no masses.  No polyp seen.  PROCEDURE IN DETAIL:  The patient was identified in the holding area. She was then taken to the operating room with IV running.  She was placed in the dorsal lithotomy position, prepped and draped in normal sterile fashion.  She received Ancef 2 g IV.  SCDs were placed on her legs and were operating.  A time-out was performed.  Graves speculum was inserted in the vagina.  The patient had a long cervix.  Paracervical block was performed.  A single-tooth tenaculum was applied to the anterior lip of the cervix.  The cervix was then dilated up to a 7 Hegar.  The scope was then advanced through the uterine cervix and the cervix was very accommodating.  The scope was then advanced through the cervix and the fibroid was identified and exploration of the cavity was performed.  There was a blood clot  underneath the fibroid. The ultrasonographer measured the fibroid and said it was 4 x 2 cm. There was no evidence of perforation.  The MyoSure REACH was obtained, and the myomectomy was started.  The myoma did not feel calcified, but it was large.  As we continued, the fluid deficit increased.  There was a small remaining portion of the fibroid.  At that time, I decided to switch to the larger MyoSure, the XL.  In order to do the XL, we had to switch out the scope, and I dilated up to 10.  The new scope was then advanced, and I was able to complete the myomectomy with a larger blade down to the endometrium.  There was some bleeding noted, but nothing that appeared active.  The MyoSure and hysteroscope were then removed from the cervix and vagina.  A curettage of the uterus was performed, and there was a sizable blood clot noted, but once the curettage was performed, there was no obvious active bleeding from the cervical os.  Of note, I did attempt to do  an endocervical curettage prior to seeing the fibroid and there was scant tissue seen, so that specimen was not sent.  The tenaculum was removed from the cervix.  The tenaculum site was hemostatic.  The Graves speculum was then removed.  The patient tolerated the procedure well.  All instrument, sponge, and needle counts were correct x3.  The patient was awakened in the room and taken to the recovery room in stable condition.     Jola Schmidt, MD     EBV/MEDQ  D:  09/15/2016  T:  09/15/2016  Job:  [id number]

## 2017-01-17 ENCOUNTER — Other Ambulatory Visit: Payer: Self-pay | Admitting: Radiology

## 2017-01-20 ENCOUNTER — Telehealth: Payer: Self-pay | Admitting: *Deleted

## 2017-01-20 DIAGNOSIS — D0512 Intraductal carcinoma in situ of left breast: Secondary | ICD-10-CM

## 2017-01-20 NOTE — Telephone Encounter (Signed)
Confirmed [facility] for 02/02/17 at 1215 .  Instructions and contact information given.

## 2017-01-29 ENCOUNTER — Encounter: Payer: Self-pay | Admitting: General Surgery

## 2017-02-02 ENCOUNTER — Ambulatory Visit: Payer: BLUE CROSS/BLUE SHIELD | Admitting: Physical Therapy

## 2017-02-02 ENCOUNTER — Encounter: Payer: Self-pay | Admitting: Radiation Oncology

## 2017-02-02 ENCOUNTER — Ambulatory Visit (HOSPITAL_BASED_OUTPATIENT_CLINIC_OR_DEPARTMENT_OTHER): Payer: BLUE CROSS/BLUE SHIELD | Admitting: Hematology and Oncology

## 2017-02-02 ENCOUNTER — Ambulatory Visit
Admission: RE | Admit: 2017-02-02 | Discharge: 2017-02-02 | Disposition: A | Discharge status: Home / Self Care | Payer: BLUE CROSS/BLUE SHIELD | Source: Ambulatory Visit | Attending: Radiation Oncology | Admitting: Radiation Oncology

## 2017-02-02 ENCOUNTER — Other Ambulatory Visit: Payer: Self-pay | Admitting: General Surgery

## 2017-02-02 ENCOUNTER — Other Ambulatory Visit (HOSPITAL_BASED_OUTPATIENT_CLINIC_OR_DEPARTMENT_OTHER): Payer: BLUE CROSS/BLUE SHIELD

## 2017-02-02 ENCOUNTER — Encounter: Payer: Self-pay | Admitting: Hematology and Oncology

## 2017-02-02 DIAGNOSIS — Z170 Estrogen receptor positive status [ER+]: Secondary | ICD-10-CM | POA: Diagnosis not present

## 2017-02-02 DIAGNOSIS — D0512 Intraductal carcinoma in situ of left breast: Secondary | ICD-10-CM | POA: Insufficient documentation

## 2017-02-02 DIAGNOSIS — C50412 Malignant neoplasm of upper-outer quadrant of left female breast: Secondary | ICD-10-CM

## 2017-02-02 DIAGNOSIS — Z510 Encounter for antineoplastic radiation therapy: Secondary | ICD-10-CM | POA: Insufficient documentation

## 2017-02-02 LAB — CBC WITH DIFFERENTIAL/PLATELET
BASO%: 0.3 % (ref 0.0–2.0)
Basophils Absolute: 0 10*3/uL (ref 0.0–0.1)
EOS%: 0.5 % (ref 0.0–7.0)
Eosinophils Absolute: 0 10*3/uL (ref 0.0–0.5)
HEMATOCRIT: 40.9 % (ref 34.8–46.6)
HEMOGLOBIN: 13.3 g/dL (ref 11.6–15.9)
LYMPH#: 2.5 10*3/uL (ref 0.9–3.3)
LYMPH%: 39.2 % (ref 14.0–49.7)
MCH: 29.2 pg (ref 25.1–34.0)
MCHC: 32.5 g/dL (ref 31.5–36.0)
MCV: 89.7 fL (ref 79.5–101.0)
MONO#: 0.4 10*3/uL (ref 0.1–0.9)
MONO%: 5.9 % (ref 0.0–14.0)
NEUT%: 54.1 % (ref 38.4–76.8)
NEUTROS ABS: 3.5 10*3/uL (ref 1.5–6.5)
PLATELETS: 195 10*3/uL (ref 145–400)
RBC: 4.56 10*6/uL (ref 3.70–5.45)
RDW: 15.5 % — AB (ref 11.2–14.5)
WBC: 6.4 10*3/uL (ref 3.9–10.3)

## 2017-02-02 LAB — COMPREHENSIVE METABOLIC PANEL
ALT: 14 U/L (ref 0–55)
ANION GAP: 8 meq/L (ref 3–11)
AST: 19 U/L (ref 5–34)
Albumin: 4.1 g/dL (ref 3.5–5.0)
Alkaline Phosphatase: 67 U/L (ref 40–150)
BILIRUBIN TOTAL: 0.87 mg/dL (ref 0.20–1.20)
BUN: 22.4 mg/dL (ref 7.0–26.0)
CALCIUM: 9.9 mg/dL (ref 8.4–10.4)
CHLORIDE: 106 meq/L (ref 98–109)
CO2: 27 mEq/L (ref 22–29)
CREATININE: 0.9 mg/dL (ref 0.6–1.1)
EGFR: 82 mL/min/{1.73_m2} — ABNORMAL LOW (ref >90)
Glucose: 86 mg/dl (ref 70–140)
Potassium: 3.9 mEq/L (ref 3.5–5.1)
Sodium: 141 mEq/L (ref 136–145)
TOTAL PROTEIN: 8 g/dL (ref 6.4–8.3)

## 2017-02-02 NOTE — Progress Notes (Signed)
Nutrition Assessment  Reason for Assessment:  Pt seen in Breast Clinic  ASSESSMENT:   56 year old female with new diagnosis of breast cancer. Past medical history reviewed.  Patient reports normal appetite  Medications:  reviewed  Labs: reviewed  Anthropometrics:   Height: 67 inches Weight: 193 lb BMI: 30.3   NUTRITION DIAGNOSIS: Food and nutrition related knowledge deficit related to new diagnosis of breast cancer as evidenced by no prior need for nutrition related information.  INTERVENTION:   Discussed and provided packet of information regarding nutritional tips for breast cancer patients.  Questions answered.  Teachback method used.  Contact information provided and patient knows to contact me with questions/concerns.    MONITORING, EVALUATION, and GOAL: Pt will consume a healthy plant based diet to maintain lean body mass throughout treatment.   Rachel Zimmerman B. Zenia Resides, [place], [place] Registered Dietitian [phone] (pager)

## 2017-02-02 NOTE — Assessment & Plan Note (Signed)
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 It is considered a precancerous lesion. DCIS is classified as a 0. It is generally detected through mammograms as calcifications. We discussed the significance of grades and its impact on prognosis. We also discussed the importance of ER and PR receptors and their implications to adjuvant treatment options. Prognosis of DCIS dependence on grade, comedo necrosis. It is anticipated that if not treated, 20-30% of DCIS can develop into invasive breast cancer.  Recommendation: 1. Breast conserving surgery 2. Followed by adjuvant radiation therapy 3. Followed by antiestrogen therapy with tamoxifen 5 years  Tamoxifen counseling: We discussed the risks and benefits of tamoxifen. These include but not limited to insomnia, hot flashes, mood changes, vaginal dryness, and weight gain. Although rare, serious side effects including endometrial cancer, risk of blood clots were also discussed. We strongly believe that the benefits far outweigh the risks. Patient understands these risks and consented to starting treatment. Planned treatment duration is 5 years.  Return to clinic after surgery to discuss the final pathology report and come up with an adjuvant treatment plan.

## 2017-02-02 NOTE — Progress Notes (Signed)
Radiation Oncology         [phone]) [phone] ________________________________  Name: Rachel Zimmerman        MRN: [id number]  Date of Service: 02/02/2017 DOB: [birth year]  CC:Sun, Gari Crown, MD  Fanny Skates, MD     REFERRING PHYSICIAN: Fanny Skates, MD   DIAGNOSIS: The encounter diagnosis was Ductal carcinoma in situ (DCIS) of left breast.   HISTORY OF PRESENT ILLNESS: Rachel Zimmerman is a 56 y.o. female seen in the multidisciplinary breast clinic for a new diagnosis of left breast cancer. The patient was found to have calcifications on a screening mammogram in the left breast. Diagnostic imaging revealed a 6 mm area of calcifications, and her ultrasound did not reveal adenopathy in the axilla. 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 She comes today to discuss options of treatment.   PREVIOUS RADIATION THERAPY: No   PAST MEDICAL HISTORY:  Past Medical History:  Diagnosis Date  . Menopause   . Vitamin D deficiency        PAST SURGICAL HISTORY: Past Surgical History:  Procedure Laterality Date  . APPENDECTOMY    . CESAREAN SECTION     x 2  . COLONOSCOPY    . DILATATION & CURETTAGE/HYSTEROSCOPY WITH MYOSURE N/A 09/15/2016   Procedure: [place];  Surgeon: Thurnell Lose, MD;  Location: [facility];  Service: Gynecology;  Laterality: N/A;  Ultrasound Guidance Myosure - Fibroids  . OPERATIVE ULTRASOUND N/A 09/15/2016   Procedure: OPERATIVE ULTRASOUND;  Surgeon: Thurnell Lose, MD;  Location: [facility];  Service: Gynecology;  Laterality: N/A;     FAMILY HISTORY:  Family History  Problem Relation Age of Onset  . Hypertension Father   . Kidney failure Father   . Diabetes Father   . Stroke Father        Deceased  . Hypercholesterolemia Mother   . Myasthenia gravis Mother        Living  . Breast cancer Maternal Aunt   . Healthy Brother   . Healthy Son     x 2     SOCIAL HISTORY:  reports that she has never smoked. She has never used smokeless tobacco. She reports that she drinks alcohol. She reports that she does not use drugs. The patient is married and lives in [place]. She works at [employer].    ALLERGIES: Benadryl [diphenhydramine hcl (sleep)]   MEDICATIONS:  No current outpatient prescriptions on file.   No current facility-administered medications for this encounter.      REVIEW OF SYSTEMS: On review of systems, the patient reports that she is doing well overall. She denies any chest pain, shortness of breath, cough, fevers, chills, night sweats, unintended weight changes. She denies any bowel or bladder disturbances, and denies abdominal pain, nausea or vomiting. She denies any new musculoskeletal or joint aches or pains. A complete review of systems is obtained and is otherwise negative.     PHYSICAL EXAM:  Wt Readings from Last 3 Encounters:  02/02/17 193 lb 1.6 oz (87.6 kg)  09/15/16 188 lb (85.3 kg)  03/05/15 186 lb 4 oz (84.5 kg)   Temp Readings from Last 3 Encounters:  02/02/17 97.6 F (36.4 C) (Oral)  09/15/16 97.6 F (36.4 C)   BP Readings from Last 3 Encounters:  02/02/17 (!) 108/94  09/15/16 133/79  03/05/15 124/80   Pulse Readings from Last 3 Encounters:  02/02/17 62  09/15/16 69  03/05/15  62      In general this is a well appearing African American female in no acute distress. She is alert and oriented x4 and appropriate throughout the examination. HEENT reveals that the patient is normocephalic, atraumatic. EOMs are intact. PERRLA. Skin is intact without any evidence of gross lesions. Cardiovascular exam reveals a regular rate and rhythm, no clicks rubs or murmurs are auscultated. Chest is clear to auscultation bilaterally. Lymphatic assessment is performed and does not reveal any adenopathy in the cervical, supraclavicular, axillary, or inguinal chains. Bilateral breast exam reveals no masses in the  right breast. There is some mild fullness in the upper outer quadrant of the left breast deep to the site of her biopsy.  No nipple bleeding or discharge was noted. Abdomen has active bowel sounds in all quadrants and is intact. The abdomen is soft, non tender, non distended. Lower extremities are negative for pretibial pitting edema, deep calf tenderness, cyanosis or clubbing.   ECOG = 0  0 - Asymptomatic (Fully active, able to carry on all predisease activities without restriction)  1 - Symptomatic but completely ambulatory (Restricted in physically strenuous activity but ambulatory and able to carry out work of a light or sedentary nature. For example, light housework, office work)  2 - Symptomatic, <50% in bed during the day (Ambulatory and capable of all self care but unable to carry out any work activities. Up and about more than 50% of waking hours)  3 - Symptomatic, >50% in bed, but not bedbound (Capable of only limited self-care, confined to bed or chair 50% or more of waking hours)  4 - Bedbound (Completely disabled. Cannot carry on any self-care. Totally confined to bed or chair)  5 - Death   Eustace Pen MM, Creech RH, Tormey DC, et al. [phone]). "Toxicity and response criteria of the [facility]". [place] Oncol. 5 (6): 649-55    LABORATORY DATA:  Lab Results  Component Value Date   WBC 6.4 02/02/2017   HGB 13.3 02/02/2017   HCT 40.9 02/02/2017   MCV 89.7 02/02/2017   PLT 195 02/02/2017   Lab Results  Component Value Date   NA 141 02/02/2017   K 3.9 02/02/2017   CO2 27 02/02/2017   Lab Results  Component Value Date   ALT 14 02/02/2017   AST 19 02/02/2017   ALKPHOS 67 02/02/2017   BILITOT 0.87 02/02/2017      RADIOGRAPHY: No results found.     IMPRESSION/PLAN: 1. 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 Dr. Lisbeth Renshaw discusses the pathology findings and reviews the nature of in situ breast disease.  The consensus from the breast conference include breast conservation with lumpectomy, followed by adjuvant external radiotherapy to the breast followed by antiestrogen therapy. We discussed the risks, benefits, short, and long term effects of radiotherapy, and the patient is interested in proceeding. Dr. Lisbeth Renshaw discusses the delivery and logistics of radiotherapy and would offer her a course of 6 1/2 weeks of treatment. We will see her back about 2 weeks after surgery to move forward with the simulation and planning process and anticipate starting radiotherapy about 4 weeks after surgery.    The above documentation reflects my direct findings during this shared patient visit. Please see the separate note by Dr. Lisbeth Renshaw on this date for the remainder of the patient's plan of care.    Carola Rhine, PAC

## 2017-02-02 NOTE — Progress Notes (Signed)
[facility] CONSULT NOTE  Patient Care Team: Donald Prose, MD as PCP - General (Family Medicine) Fanny Skates, MD as Consulting Physician (General Surgery) Nicholas Lose, MD as Consulting Physician (Hematology and Oncology) Kyung Rudd, MD as Consulting Physician (Radiation Oncology)  CHIEF COMPLAINTS/PURPOSE OF CONSULTATION:  Newly diagnosed left breast DCIS  HISTORY OF PRESENTING ILLNESS:  Rachel Zimmerman 56 y.o. female is here because of recent diagnosis of left breast DCIS. Patient had a screening mammogram the detected left breast calcifications spanning 6 mm in size. Biopsy of these calcifications revealed high-grade DCIS with calcifications and necrosis that was ER/PR positive. She was presented this morning in the multidisciplinary tumor board and she is here today to discuss the treatment plan at [facility] clinic.  I reviewed her records extensively and collaborated the history with the patient.  SUMMARY OF ONCOLOGIC HISTORY:   Ductal carcinoma in situ (DCIS) of left breast   01/17/2017 Initial Diagnosis    Screening detected left breast calcifications 6 mm span ultrasound axilla negative, biopsy high-grade DCIS with calcifications and necrosis plus ALH, ER 95%, PR 70%, Tis N0 stage 0      MEDICAL HISTORY:  Past Medical History:  Diagnosis Date  . Menopause   . Vitamin D deficiency     SURGICAL HISTORY: Past Surgical History:  Procedure Laterality Date  . APPENDECTOMY    . CESAREAN SECTION     x 2  . COLONOSCOPY    . DILATATION & CURETTAGE/HYSTEROSCOPY WITH MYOSURE N/A 09/15/2016   Procedure: [place];  Surgeon: Thurnell Lose, MD;  Location: [facility];  Service: Gynecology;  Laterality: N/A;  Ultrasound Guidance Myosure - Fibroids  . OPERATIVE ULTRASOUND N/A 09/15/2016   Procedure: OPERATIVE ULTRASOUND;  Surgeon: Thurnell Lose, MD;  Location: [facility];  Service: Gynecology;  Laterality: N/A;    SOCIAL  HISTORY: Social History   Social History  . Marital status: Married    Spouse name: N/A  . Number of children: N/A  . Years of education: N/A   Occupational History  . Not on file.   Social History Main Topics  . Smoking status: Never Smoker  . Smokeless tobacco: Never Used  . Alcohol use 0.0 oz/week     Comment: Socially a few times per year  . Drug use: No  . Sexual activity: Not on file   Other Topics Concern  . Not on file   Social History Narrative   Married. Lives in a two story home.  2 sons.    Works as a Warden/ranger for [employer]     Education: college.    FAMILY HISTORY: Family History  Problem Relation Age of Onset  . Hypertension Father   . Kidney failure Father   . Diabetes Father   . Stroke Father        Deceased  . Hypercholesterolemia Mother   . Myasthenia gravis Mother        Living  . Breast cancer Maternal Aunt   . Healthy Brother   . Healthy Son        x 2    ALLERGIES:  is allergic to benadryl [diphenhydramine hcl (sleep)].  MEDICATIONS:  No current outpatient prescriptions on file.   No current facility-administered medications for this visit.     REVIEW OF SYSTEMS:   Constitutional: Denies fevers, chills or abnormal night sweats Eyes: Denies blurriness of vision, double vision or watery eyes Ears, nose, mouth, throat, and face: Denies  mucositis or sore throat Respiratory: Denies cough, dyspnea or wheezes Cardiovascular: Denies palpitation, chest discomfort or lower extremity swelling Gastrointestinal:  Denies nausea, heartburn or change in bowel habits Skin: Denies abnormal skin rashes Lymphatics: Denies new lymphadenopathy or easy bruising Neurological:Denies numbness, tingling or new weaknesses Behavioral/Psych: Mood is stable, no new changes  Breast:  Denies any palpable lumps or discharge All other systems were reviewed with the patient and are negative.  PHYSICAL EXAMINATION: ECOG PERFORMANCE STATUS: 0 -  Asymptomatic  Vitals:   02/02/17 1241  BP: (!) 108/94  Pulse: 62  Resp: 18  Temp: 97.6 F (36.4 C)   Filed Weights   02/02/17 1241  Weight: 193 lb 1.6 oz (87.6 kg)    GENERAL:alert, no distress and comfortable SKIN: skin color, texture, turgor are normal, no rashes or significant lesions EYES: normal, conjunctiva are pink and non-injected, sclera clear OROPHARYNX:no exudate, no erythema and lips, buccal mucosa, and tongue normal  NECK: supple, thyroid normal size, non-tender, without nodularity LYMPH:  no palpable lymphadenopathy in the cervical, axillary or inguinal LUNGS: clear to auscultation and percussion with normal breathing effort HEART: regular rate & rhythm and no murmurs and no lower extremity edema ABDOMEN:abdomen soft, non-tender and normal bowel sounds Musculoskeletal:no cyanosis of digits and no clubbing  PSYCH: alert & oriented x 3 with fluent speech NEURO: no focal motor/sensory deficits BREAST: No palpable nodules in breast. No palpable axillary or supraclavicular lymphadenopathy (exam performed in the presence of a chaperone)   LABORATORY DATA:  I have reviewed the data as listed Lab Results  Component Value Date   WBC 6.4 02/02/2017   HGB 13.3 02/02/2017   HCT 40.9 02/02/2017   MCV 89.7 02/02/2017   PLT 195 02/02/2017   Lab Results  Component Value Date   NA 141 02/02/2017   K 3.9 02/02/2017   CO2 27 02/02/2017    RADIOGRAPHIC STUDIES: I have personally reviewed the radiological reports and agreed with the findings in the report.  ASSESSMENT AND PLAN:  Ductal carcinoma in situ (DCIS) of left breast 01/17/2017: Screening detected left breast calcifications 6 mm span ultrasound axilla negative, biopsy high-grade DCIS with calcifications and necrosis plus ALH, ER 95%, PR 70%, Tis N0 stage 0  Pathology review: I discussed with the patient the difference between DCIS and invasive breast cancer. It is considered a precancerous lesion. DCIS is  classified as a 0. It is generally detected through mammograms as calcifications. We discussed the significance of grades and its impact on prognosis. We also discussed the importance of ER and PR receptors and their implications to adjuvant treatment options. Prognosis of DCIS dependence on grade, comedo necrosis. It is anticipated that if not treated, 20-30% of DCIS can develop into invasive breast cancer.  Recommendation: 1. Breast conserving surgery 2. Followed by adjuvant radiation therapy 3. Followed by antiestrogen therapy with tamoxifen 5 years  Tamoxifen counseling: We discussed the risks and benefits of tamoxifen. These include but not limited to insomnia, hot flashes, mood changes, vaginal dryness, and weight gain. Although rare, serious side effects including endometrial cancer, risk of blood clots were also discussed. We strongly believe that the benefits far outweigh the risks. Patient understands these risks and consented to starting treatment. Planned treatment duration is 5 years.  Return to clinic after surgery to discuss the final pathology report and come up with an adjuvant treatment plan.   All questions were answered. The patient knows to call the clinic with any problems, questions or concerns.  Rulon Eisenmenger, MD 02/02/17

## 2017-02-03 ENCOUNTER — Encounter: Payer: Self-pay | Admitting: General Practice

## 2017-02-03 NOTE — Progress Notes (Signed)
[facility] Psychosocial Distress Screening Spiritual Care  LVM for Santiago Glad following Breast Multidisciplinary Clinic to introduce [facility] team/resources, reviewing distress screen per protocol.  The patient scored a [unspecified] on the Psychosocial Distress Thermometer which indicates [unspecified] distress. Also assessed for distress and other psychosocial needs.   ONCBCN DISTRESS SCREENING 02/03/2017  Screening Type Initial Screening  Referral to support programs Yes    Follow up needed: Yes.  LVM of introduction to [facility] with encouragement to return call.  She has full packet of team/programming info.  Plan to reach out again by phone if she doesn't return call.   [place], [place], [facility] Pager [phone] Voicemail [phone]

## 2017-02-07 ENCOUNTER — Encounter: Payer: Self-pay | Admitting: General Practice

## 2017-02-07 NOTE — Progress Notes (Signed)
[facility] Spiritual Care Note  Rachel Zimmerman by phone.  She was very appreciative of all the layers of support and encouragement provided by her team.  She verbalized gratitude for the call and comforting tone/info.  Normalized feelings of distress, reviewed Support Center team/resources, and encouraged her to contact us at any time.  At this point, Rachel Zimmerman has not shared dx with many family members or friends, including children (21, 54) because she wants them to enjoy summer and focus on school.  She plans to continue to discern about sharing news as she herself understands more about what tx will be like.  Rachel Zimmerman plans to make use of the healing garden for calming time and to [facility] as needs arise.   [place], [place], [facility] Pager [phone] Voicemail [phone]

## 2017-02-08 ENCOUNTER — Telehealth: Payer: Self-pay | Admitting: *Deleted

## 2017-02-08 NOTE — Telephone Encounter (Signed)
Left message for a return phone to call to follow up from [facility].

## 2017-02-11 ENCOUNTER — Other Ambulatory Visit: Payer: Self-pay | Admitting: *Deleted

## 2017-02-11 DIAGNOSIS — D0512 Intraductal carcinoma in situ of left breast: Secondary | ICD-10-CM

## 2017-02-14 ENCOUNTER — Telehealth: Payer: Self-pay | Admitting: Hematology and Oncology

## 2017-02-14 NOTE — Telephone Encounter (Signed)
lvm to inform pt of 9/10 appt at 10 am per sch msg

## 2017-02-28 ENCOUNTER — Encounter (HOSPITAL_BASED_OUTPATIENT_CLINIC_OR_DEPARTMENT_OTHER): Payer: Self-pay | Admitting: *Deleted

## 2017-03-02 NOTE — H&P (Signed)
Rachel Zimmerman Location: [facility] Patient #: [id number] DOB: [birth year] Unknown / Language: Cleophus Molt / Race: Black or African American Female        History of Present Illness       The patient is a 56 year old female who presents with breast cancer. This is a 56 year old female, referred by Dr. Marcelo Baldy at [facility] mammography for evaluation of a newly diagnosed localized area of ductal carcinoma in situ left breast, upper outer quadrant, high-grade. Dr. Donald Prose is her PCP. She is evaluated in the [facility] today by Dr. Lindi Adie, Dr. Lisbeth Renshaw, and me.      She has no prior breast problems. She gets annual mammograms. She recently had very irregular menses and was seen by gynecologist which led to a D&C and hysteroscopy with benign findings. Screening mammogram showed a small area of calcifications in the left breast at the 2:00 position, 7 cm from the nipple. Axillary ultrasound was negative. Image guided biopsy of the left breast upper outer quadrant showed high-grade DCIS and atypical lobular hyperplasia. ER 95%. PR 70%       It is notable that the biopsy clip is 2 cm away from the area of calcifications at target. The clip is lateral and anterior to the target.       Comorbidities include prior C-section. Prior appendectomy. Prior D&C and hysteroscopy. She is a keloid former and has had multiple keloids on her body. Family history reveals paternal aunt had breast cancer and survived. Mother deceased of gallbladder cancer. Had myasthenia. Father deceased of stroke hypertension diabetes Social history reveals she is married. Her husband is at work today but she has a close personal friend with her throughout the encounter including chaperone. Denies tobacco. Rare alcohol. She has 2 sons. 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      We had a long talk about options including lumpectomy, mastectomy with or without reconstruction. She is most interested in  lumpectomy and I think she is an excellent candidate for that. This will be followed by whole breast radiation therapy and antiestrogen therapy.      She'll be scheduled for left breast lumpectomy with radioactive seed localization. I discussed the indications, details, techniques, and numerous risk of the surgery with her and her friend. She is aware of the risk of bleeding, infection, cosmetic deformity, reoperation for positive margins or invasive disease, and other interesting problem. She understands all these issues well. All of her questions are answered. She agrees with this plan.     Past Surgical History  Appendectomy  Breast Biopsy  Left. Cesarean Section - 1   Diagnostic Studies History  Colonoscopy  within last year Mammogram  within last year Pap Smear  1-5 years ago  Medication History  Medications Reconciled  Social History  Caffeine use  Coffee, Tea. No alcohol use  No drug use  Tobacco use  Never smoker.  Family History  Breast Cancer  Family Members In General.  Pregnancy / Birth History  Age at menarche  16 years. Age of menopause  84-52 Gravida  3 Length (months) of breastfeeding  3-6 Maternal age  76-35 Para  2 Regular periods   Other Problems  Arthritis     Review of Systems  General Not Present- Appetite Loss, Chills, Fatigue, Fever, Night Sweats, Weight Gain and Weight Loss. Skin Not Present- Change in Wart/Mole, Dryness, Hives, Jaundice, New Lesions, Non-Healing Wounds, Rash and Ulcer. HEENT Not Present- Earache, Hearing Loss, Hoarseness,  Nose Bleed, Oral Ulcers, Ringing in the Ears, Seasonal Allergies, Sinus Pain, Sore Throat, Visual Disturbances, Wears glasses/contact lenses and Yellow Eyes. Breast Present- Breast Mass. Not Present- Breast Pain, Nipple Discharge and Skin Changes. Cardiovascular Not Present- Chest Pain, Difficulty Breathing Lying Down, Leg Cramps, Palpitations, Rapid Heart Rate, Shortness of Breath  and Swelling of Extremities. Gastrointestinal Not Present- Abdominal Pain, Bloating, Bloody Stool, Change in Bowel Habits, Chronic diarrhea, Constipation, Difficulty Swallowing, Excessive gas, Gets full quickly at meals, Hemorrhoids, Indigestion, Nausea, Rectal Pain and Vomiting. Female Genitourinary Not Present- Frequency, Nocturia, Painful Urination, Pelvic Pain and Urgency. Musculoskeletal Not Present- Back Pain, Joint Pain, Joint Stiffness, Muscle Pain, Muscle Weakness and Swelling of Extremities. Neurological Not Present- Decreased Memory, Fainting, Headaches, Numbness, Seizures, Tingling, Tremor, Trouble walking and Weakness. Psychiatric Not Present- Anxiety, Bipolar, Change in Sleep Pattern, Depression, Fearful and Frequent crying. Endocrine Not Present- Cold Intolerance, Excessive Hunger, Hair Changes, Heat Intolerance, Hot flashes and New Diabetes. Hematology Not Present- Blood Thinners, Easy Bruising, Excessive bleeding, Gland problems, HIV and Persistent Infections.   Physical Exam  General Mental Status-Alert. General Appearance-Consistent with stated age. Hydration-Well hydrated. Voice-Normal.  Head and Neck Head-normocephalic, atraumatic with no lesions or palpable masses. Trachea-midline. Thyroid Gland Characteristics - normal size and consistency.  Eye Eyeball - Bilateral-Extraocular movements intact. Sclera/Conjunctiva - Bilateral-No scleral icterus.  Chest and Lung Exam Chest and lung exam reveals -quiet, even and easy respiratory effort with no use of accessory muscles and on auscultation, normal breath sounds, no adventitious sounds and normal vocal resonance. Inspection Chest Wall - Normal. Back - normal.  Breast Note: Breast are medium size. 38C bra size. Minimal ecchymoses left breast. No palpable mass. No other skin changes. No axillary adenopathy on either side.   Cardiovascular Cardiovascular examination reveals -normal heart sounds,  regular rate and rhythm with no murmurs and normal pedal pulses bilaterally.  Abdomen Inspection Inspection of the abdomen reveals - No Hernias. Skin - Scar - Note: Appendectomy scars. C-section scar. Palpation/Percussion Palpation and Percussion of the abdomen reveal - Soft, Non Tender, No Rebound tenderness, No Rigidity (guarding) and No hepatosplenomegaly. Auscultation Auscultation of the abdomen reveals - Bowel sounds normal.  Neurologic Neurologic evaluation reveals -alert and oriented x 3 with no impairment of recent or remote memory. Mental Status-Normal.  Musculoskeletal Normal Exam - Left-Upper Extremity Strength Normal and Lower Extremity Strength Normal. Normal Exam - Right-Upper Extremity Strength Normal and Lower Extremity Strength Normal.  Lymphatic Head & Neck  General Head & Neck Lymphatics: Bilateral - Description - Normal. Axillary  General Axillary Region: Bilateral - Description - Normal. Tenderness - Non Tender. Femoral & Inguinal  Generalized Femoral & Inguinal Lymphatics: Bilateral - Description - Normal. Tenderness - Non Tender.    Assessment and Plan: PRIMARY CANCER OF UPPER OUTER QUADRANT OF LEFT FEMALE BREAST (C50.412)    Your recent imaging studies and biopsy show an area of high-grade ductal carcinoma in situ left breast, upper outer quadrant The tumor is estrogen receptor positive and progesterone receptor positive  We have discussed surgical options of mastectomy versus lumpectomy. Plastic surgical reconstruction, lymph node biopsy You are an excellent candidate for a left breast lumpectomy, to be followed by radiation therapy and antiestrogen therapy once you heal. I do not think that you need a lymph node biopsy You state that you are in full agreement  You will be scheduled for left breast lumpectomy with radioactive seed localization I have discussed the indications, techniques, and risks of the surgery in detail  Dr. Darrel Hoover  office will call you tomorrow to begin the scheduling process  H/O KELOID OF SKIN (Z87.2) HISTORY OF APPENDECTOMY (Z90.49) HISTORY OF C-SECTION (I21.798)    Edsel Petrin. Dalbert Batman, M.D., [facility] General and Minimally invasive Surgery Breast and Colorectal Surgery Office:   [phone] Pager:   [phone]

## 2017-03-03 ENCOUNTER — Other Ambulatory Visit: Payer: Self-pay | Admitting: General Surgery

## 2017-03-03 NOTE — Progress Notes (Signed)
Boost given to pt and instructions reviewed

## 2017-03-03 NOTE — Anesthesia Preprocedure Evaluation (Addendum)
Anesthesia Evaluation  Patient identified by MRN, date of birth, ID band Patient awake    Reviewed: Allergy & Precautions, NPO status , Patient's Chart, lab work & pertinent test results  Airway Mallampati: II   Neck ROM: full    Dental  (+) Dental Advisory Given   Pulmonary neg pulmonary ROS,    breath sounds clear to auscultation       Cardiovascular negative cardio ROS   Rhythm:regular Rate:Normal     Neuro/Psych negative neurological ROS  negative psych ROS   GI/Hepatic negative GI ROS, Neg liver ROS,   Endo/Other  Obesity  Renal/GU negative Renal ROS  negative genitourinary   Musculoskeletal negative musculoskeletal ROS (+)   Abdominal   Peds  Hematology negative hematology ROS (+)   Anesthesia Other Findings DCIS Left Breast  Reproductive/Obstetrics Post-menopausal bleeding                            Anesthesia Physical  Anesthesia Plan  ASA: II  Anesthesia Plan: General   Post-op Pain Management:    Induction: Intravenous  PONV Risk Score and Plan: 4 or greater and Ondansetron, Dexamethasone, Midazolam, Scopolamine patch - Pre-op, Propofol infusion and Treatment may vary due to age or medical condition  Airway Management Planned: LMA  Additional Equipment:   Intra-op Plan:   Post-operative Plan: Extubation in OR  Informed Consent: I have reviewed the patients History and Physical, chart, labs and discussed the procedure including the risks, benefits and alternatives for the proposed anesthesia with the patient or authorized representative who has indicated his/her understanding and acceptance.   Dental advisory given  Plan Discussed with: CRNA  Anesthesia Plan Comments:         Anesthesia Quick Evaluation

## 2017-03-04 ENCOUNTER — Ambulatory Visit (HOSPITAL_BASED_OUTPATIENT_CLINIC_OR_DEPARTMENT_OTHER)
Admission: RE | Admit: 2017-03-04 | Discharge: 2017-03-04 | Disposition: A | Discharge status: Home / Self Care | Payer: BLUE CROSS/BLUE SHIELD | Source: Ambulatory Visit | Attending: General Surgery | Admitting: General Surgery

## 2017-03-04 ENCOUNTER — Encounter (HOSPITAL_BASED_OUTPATIENT_CLINIC_OR_DEPARTMENT_OTHER): Payer: Self-pay | Admitting: Certified Registered"

## 2017-03-04 ENCOUNTER — Ambulatory Visit (HOSPITAL_BASED_OUTPATIENT_CLINIC_OR_DEPARTMENT_OTHER): Payer: BLUE CROSS/BLUE SHIELD | Admitting: Anesthesiology

## 2017-03-04 ENCOUNTER — Encounter (HOSPITAL_BASED_OUTPATIENT_CLINIC_OR_DEPARTMENT_OTHER)
Admission: RE | Disposition: A | Discharge status: Home / Self Care | Payer: Self-pay | Source: Ambulatory Visit | Attending: General Surgery

## 2017-03-04 DIAGNOSIS — Z79899 Other long term (current) drug therapy: Secondary | ICD-10-CM | POA: Diagnosis not present

## 2017-03-04 DIAGNOSIS — M1990 Unspecified osteoarthritis, unspecified site: Secondary | ICD-10-CM | POA: Diagnosis not present

## 2017-03-04 DIAGNOSIS — E669 Obesity, unspecified: Secondary | ICD-10-CM | POA: Insufficient documentation

## 2017-03-04 DIAGNOSIS — D0512 Intraductal carcinoma in situ of left breast: Secondary | ICD-10-CM | POA: Diagnosis present

## 2017-03-04 DIAGNOSIS — Z803 Family history of malignant neoplasm of breast: Secondary | ICD-10-CM | POA: Insufficient documentation

## 2017-03-04 DIAGNOSIS — Z170 Estrogen receptor positive status [ER+]: Secondary | ICD-10-CM | POA: Insufficient documentation

## 2017-03-04 HISTORY — PX: BREAST LUMPECTOMY WITH RADIOACTIVE SEED LOCALIZATION: SHX6424

## 2017-03-04 SURGERY — BREAST LUMPECTOMY WITH RADIOACTIVE SEED LOCALIZATION
Anesthesia: General | Site: Breast | Laterality: Left

## 2017-03-04 MED ORDER — ONDANSETRON HCL 4 MG/2ML IJ SOLN
INTRAMUSCULAR | Status: DC | PRN
  Administered 2017-03-04: 4 mg via INTRAVENOUS

## 2017-03-04 MED ORDER — BUPIVACAINE-EPINEPHRINE (PF) 0.5% -1:200000 IJ SOLN
INTRAMUSCULAR | Status: AC
  Filled 2017-03-04: qty 30

## 2017-03-04 MED ORDER — PROPOFOL 500 MG/50ML IV EMUL
INTRAVENOUS | Status: AC
  Filled 2017-03-04: qty 50

## 2017-03-04 MED ORDER — EPHEDRINE SULFATE 50 MG/ML IJ SOLN
INTRAMUSCULAR | Status: DC | PRN
  Administered 2017-03-04 (×2): 10 mg via INTRAVENOUS

## 2017-03-04 MED ORDER — BUPIVACAINE-EPINEPHRINE (PF) 0.5% -1:200000 IJ SOLN
INTRAMUSCULAR | Status: DC | PRN
  Administered 2017-03-04: 20 mL via PERINEURAL

## 2017-03-04 MED ORDER — SCOPOLAMINE 1 MG/3DAYS TD PT72: 1.0000 | MEDICATED_PATCH | Freq: Once | TRANSDERMAL | Status: DC | PRN

## 2017-03-04 MED ORDER — HYDROCODONE-ACETAMINOPHEN 5-325 MG PO TABS: 1.0000 | ORAL_TABLET | Freq: Four times a day (QID) | ORAL | 0 refills | Status: DC | PRN

## 2017-03-04 MED ORDER — DEXAMETHASONE SODIUM PHOSPHATE 10 MG/ML IJ SOLN
INTRAMUSCULAR | Status: AC
  Filled 2017-03-04: qty 1

## 2017-03-04 MED ORDER — FENTANYL CITRATE (PF) 100 MCG/2ML IJ SOLN: 25.0000 ug | INTRAMUSCULAR | Status: DC | PRN

## 2017-03-04 MED ORDER — GABAPENTIN 300 MG PO CAPS
ORAL_CAPSULE | ORAL | Status: AC
  Filled 2017-03-04: qty 1

## 2017-03-04 MED ORDER — ONDANSETRON HCL 4 MG/2ML IJ SOLN
INTRAMUSCULAR | Status: AC
  Filled 2017-03-04: qty 2

## 2017-03-04 MED ORDER — CHLORHEXIDINE GLUCONATE CLOTH 2 % EX PADS: 6.0000 | MEDICATED_PAD | Freq: Once | CUTANEOUS | Status: DC

## 2017-03-04 MED ORDER — CEFAZOLIN SODIUM-DEXTROSE 2-4 GM/100ML-% IV SOLN
2.0000 g | INTRAVENOUS | Status: AC
  Administered 2017-03-04: 2 g via INTRAVENOUS

## 2017-03-04 MED ORDER — MIDAZOLAM HCL 2 MG/2ML IJ SOLN
1.0000 mg | INTRAMUSCULAR | Status: DC | PRN
  Administered 2017-03-04: 2 mg via INTRAVENOUS

## 2017-03-04 MED ORDER — ACETAMINOPHEN 500 MG PO TABS
1000.0000 mg | ORAL_TABLET | ORAL | Status: AC
  Administered 2017-03-04: 1000 mg via ORAL

## 2017-03-04 MED ORDER — LIDOCAINE 2% (20 MG/ML) 5 ML SYRINGE
INTRAMUSCULAR | Status: AC
  Filled 2017-03-04: qty 15

## 2017-03-04 MED ORDER — CELECOXIB 200 MG PO CAPS
ORAL_CAPSULE | ORAL | Status: AC
  Filled 2017-03-04: qty 2

## 2017-03-04 MED ORDER — GABAPENTIN 300 MG PO CAPS
300.0000 mg | ORAL_CAPSULE | ORAL | Status: AC
  Administered 2017-03-04: 300 mg via ORAL

## 2017-03-04 MED ORDER — LACTATED RINGERS IV SOLN
INTRAVENOUS | Status: DC
  Administered 2017-03-04 (×2): via INTRAVENOUS

## 2017-03-04 MED ORDER — DEXAMETHASONE SODIUM PHOSPHATE 4 MG/ML IJ SOLN
INTRAMUSCULAR | Status: DC | PRN
  Administered 2017-03-04: 10 mg via INTRAVENOUS

## 2017-03-04 MED ORDER — PROPOFOL 10 MG/ML IV BOLUS
INTRAVENOUS | Status: DC | PRN
  Administered 2017-03-04: 150 mg via INTRAVENOUS

## 2017-03-04 MED ORDER — FENTANYL CITRATE (PF) 100 MCG/2ML IJ SOLN
INTRAMUSCULAR | Status: AC
  Filled 2017-03-04: qty 2

## 2017-03-04 MED ORDER — FENTANYL CITRATE (PF) 100 MCG/2ML IJ SOLN
50.0000 ug | INTRAMUSCULAR | Status: DC | PRN
  Administered 2017-03-04 (×2): 50 ug via INTRAVENOUS

## 2017-03-04 MED ORDER — CEFAZOLIN SODIUM-DEXTROSE 2-4 GM/100ML-% IV SOLN
INTRAVENOUS | Status: AC
  Filled 2017-03-04: qty 100

## 2017-03-04 MED ORDER — ONDANSETRON HCL 4 MG/2ML IJ SOLN
INTRAMUSCULAR | Status: AC
  Filled 2017-03-04: qty 8

## 2017-03-04 MED ORDER — PROMETHAZINE HCL 25 MG/ML IJ SOLN: 6.2500 mg | INTRAMUSCULAR | Status: DC | PRN

## 2017-03-04 MED ORDER — MIDAZOLAM HCL 2 MG/2ML IJ SOLN
INTRAMUSCULAR | Status: AC
  Filled 2017-03-04: qty 2

## 2017-03-04 MED ORDER — DEXAMETHASONE SODIUM PHOSPHATE 10 MG/ML IJ SOLN
INTRAMUSCULAR | Status: AC
  Filled 2017-03-04: qty 2

## 2017-03-04 MED ORDER — LIDOCAINE 2% (20 MG/ML) 5 ML SYRINGE
INTRAMUSCULAR | Status: AC
  Filled 2017-03-04: qty 5

## 2017-03-04 MED ORDER — ACETAMINOPHEN 500 MG PO TABS
ORAL_TABLET | ORAL | Status: AC
  Filled 2017-03-04: qty 2

## 2017-03-04 MED ORDER — LIDOCAINE HCL (CARDIAC) 20 MG/ML IV SOLN
INTRAVENOUS | Status: DC | PRN
  Administered 2017-03-04: 60 mg via INTRAVENOUS

## 2017-03-04 MED ORDER — CELECOXIB 400 MG PO CAPS
400.0000 mg | ORAL_CAPSULE | ORAL | Status: AC
  Administered 2017-03-04: 400 mg via ORAL

## 2017-03-04 SURGICAL SUPPLY — 67 items
ADH SKN CLS APL DERMABOND .7 (GAUZE/BANDAGES/DRESSINGS) ×1
APL SKNCLS STERI-STRIP NONHPOA (GAUZE/BANDAGES/DRESSINGS)
APPLIER CLIP 9.375 MED OPEN (MISCELLANEOUS) ×3
APR CLP MED 9.3 20 MLT OPN (MISCELLANEOUS) ×1
BENZOIN TINCTURE PRP APPL 2/3 (GAUZE/BANDAGES/DRESSINGS) IMPLANT
BINDER BREAST LRG (GAUZE/BANDAGES/DRESSINGS) IMPLANT
BINDER BREAST MEDIUM (GAUZE/BANDAGES/DRESSINGS) IMPLANT
BINDER BREAST XLRG (GAUZE/BANDAGES/DRESSINGS) IMPLANT
BINDER BREAST XXLRG (GAUZE/BANDAGES/DRESSINGS) IMPLANT
BLADE HEX COATED 2.75 (ELECTRODE) ×3 IMPLANT
BLADE SURG 10 STRL SS (BLADE) ×2 IMPLANT
BLADE SURG 15 STRL LF DISP TIS (BLADE) ×1 IMPLANT
BLADE SURG 15 STRL SS (BLADE) ×3
CANISTER SUC SOCK COL 7IN (MISCELLANEOUS) IMPLANT
CANISTER SUCT 1200ML W/VALVE (MISCELLANEOUS) ×3 IMPLANT
CHLORAPREP W/TINT 26ML (MISCELLANEOUS) ×3 IMPLANT
CLIP APPLIE 9.375 MED OPEN (MISCELLANEOUS) IMPLANT
CLOSURE WOUND 1/2 X4 (GAUZE/BANDAGES/DRESSINGS)
COVER BACK TABLE 60X90IN (DRAPES) ×3 IMPLANT
COVER MAYO STAND STRL (DRAPES) ×3 IMPLANT
COVER PROBE W GEL 5X96 (DRAPES) ×3 IMPLANT
DECANTER SPIKE VIAL GLASS SM (MISCELLANEOUS) IMPLANT
DERMABOND ADVANCED (GAUZE/BANDAGES/DRESSINGS) ×2
DERMABOND ADVANCED .7 DNX12 (GAUZE/BANDAGES/DRESSINGS) ×1 IMPLANT
DEVICE DUBIN W/COMP PLATE 8390 (MISCELLANEOUS) ×3 IMPLANT
DRAPE LAPAROSCOPIC ABDOMINAL (DRAPES) ×3 IMPLANT
DRAPE UTILITY XL STRL (DRAPES) ×3 IMPLANT
DRSG PAD ABDOMINAL 8X10 ST (GAUZE/BANDAGES/DRESSINGS) ×2 IMPLANT
ELECT REM PT RETURN 9FT ADLT (ELECTROSURGICAL) ×3
ELECTRODE REM PT RTRN 9FT ADLT (ELECTROSURGICAL) ×1 IMPLANT
GAUZE SPONGE 4X4 12PLY STRL LF (GAUZE/BANDAGES/DRESSINGS) ×2 IMPLANT
GLOVE BIO SURGEON STRL SZ 6.5 (GLOVE) ×1 IMPLANT
GLOVE BIO SURGEONS STRL SZ 6.5 (GLOVE) ×1
GLOVE BIOGEL PI IND STRL 7.0 (GLOVE) IMPLANT
GLOVE BIOGEL PI INDICATOR 7.0 (GLOVE) ×4
GLOVE EUDERMIC 7 POWDERFREE (GLOVE) ×5 IMPLANT
GOWN STRL REUS W/ TWL LRG LVL3 (GOWN DISPOSABLE) ×1 IMPLANT
GOWN STRL REUS W/ TWL XL LVL3 (GOWN DISPOSABLE) ×1 IMPLANT
GOWN STRL REUS W/TWL LRG LVL3 (GOWN DISPOSABLE) ×3
GOWN STRL REUS W/TWL XL LVL3 (GOWN DISPOSABLE) ×3
ILLUMINATOR WAVEGUIDE N/F (MISCELLANEOUS) IMPLANT
KIT MARKER MARGIN INK (KITS) ×3 IMPLANT
LIGHT WAVEGUIDE WIDE FLAT (MISCELLANEOUS) IMPLANT
NDL HYPO 25X1 1.5 SAFETY (NEEDLE) ×1 IMPLANT
NEEDLE HYPO 25X1 1.5 SAFETY (NEEDLE) ×3 IMPLANT
NS IRRIG 1000ML POUR BTL (IV SOLUTION) ×3 IMPLANT
PACK BASIN DAY SURGERY FS (CUSTOM PROCEDURE TRAY) ×3 IMPLANT
PENCIL BUTTON HOLSTER BLD 10FT (ELECTRODE) ×3 IMPLANT
SHEET MEDIUM DRAPE 40X70 STRL (DRAPES) IMPLANT
SLEEVE SCD COMPRESS KNEE MED (MISCELLANEOUS) ×3 IMPLANT
SPONGE LAP 18X18 X RAY DECT (DISPOSABLE) IMPLANT
SPONGE LAP 4X18 X RAY DECT (DISPOSABLE) ×5 IMPLANT
STRIP CLOSURE SKIN 1/2X4 (GAUZE/BANDAGES/DRESSINGS) IMPLANT
SUT ETHILON 3 0 FSL (SUTURE) IMPLANT
SUT MNCRL AB 4-0 PS2 18 (SUTURE) ×3 IMPLANT
SUT SILK 2 0 SH (SUTURE) ×3 IMPLANT
SUT VIC AB 2-0 CT1 27 (SUTURE)
SUT VIC AB 2-0 CT1 TAPERPNT 27 (SUTURE) IMPLANT
SUT VIC AB 3-0 SH 27 (SUTURE)
SUT VIC AB 3-0 SH 27X BRD (SUTURE) IMPLANT
SUT VICRYL 3-0 CR8 SH (SUTURE) ×3 IMPLANT
SYR 10ML LL (SYRINGE) ×3 IMPLANT
TOWEL OR 17X24 6PK STRL BLUE (TOWEL DISPOSABLE) ×3 IMPLANT
TOWEL OR NON WOVEN STRL DISP B (DISPOSABLE) ×2 IMPLANT
TUBE CONNECTING 20'X1/4 (TUBING) ×1
TUBE CONNECTING 20X1/4 (TUBING) ×2 IMPLANT
YANKAUER SUCT BULB TIP NO VENT (SUCTIONS) ×3 IMPLANT

## 2017-03-04 NOTE — Transfer of Care (Signed)
Immediate Anesthesia Transfer of Care Note  Patient: Rachel Zimmerman  Procedure(s) Performed: Procedure(s): BREAST LUMPECTOMY WITH RADIOACTIVE SEED LOCALIZATION (Left)  Patient Location: PACU  Anesthesia Type:General  Level of Consciousness: awake and patient cooperative  Airway & Oxygen Therapy: Patient Spontanous Breathing and Patient connected to face mask oxygen  Post-op Assessment: Report given to RN and Post -op Vital signs reviewed and stable  Post vital signs: Reviewed and stable  Last Vitals:  Vitals:   03/04/17 0650  BP: 108/67  Pulse: 63  Resp: 16  Temp: 36.6 C  SpO2: 100%    Last Pain:  Vitals:   03/04/17 0650  TempSrc: Oral  PainSc: 0-No pain      Patients Stated Pain Goal: 3 (91/66/06 0045)  Complications: No apparent anesthesia complications

## 2017-03-04 NOTE — Interval H&P Note (Signed)
History and Physical Interval Note:  03/04/2017 6:25 AM  Rachel Zimmerman  has presented today for surgery, with the diagnosis of LEFT BREAST CANCER  The various methods of treatment have been discussed with the patient and family. After consideration of risks, benefits and other options for treatment, the patient has consented to  Procedure(s): BREAST LUMPECTOMY WITH RADIOACTIVE SEED LOCALIZATION (Left) as a surgical intervention .  The patient's history has been reviewed, patient examined, no change in status, stable for surgery.  I have reviewed the patient's chart and labs.  Questions were answered to the patient's satisfaction.     Adin Hector

## 2017-03-04 NOTE — Anesthesia Postprocedure Evaluation (Signed)
Anesthesia Post Note  Patient: Rachel Zimmerman  Procedure(s) Performed: Procedure(s) (LRB): BREAST LUMPECTOMY WITH RADIOACTIVE SEED LOCALIZATION (Left)     Patient location during evaluation: PACU Anesthesia Type: General Level of consciousness: awake and alert Pain management: pain level controlled Vital Signs Assessment: post-procedure vital signs reviewed and stable Respiratory status: spontaneous breathing, nonlabored ventilation and respiratory function stable Cardiovascular status: blood pressure returned to baseline and stable Postop Assessment: no signs of nausea or vomiting Anesthetic complications: no    Last Vitals:  Vitals:   03/04/17 0905 03/04/17 0915  BP:  134/80  Pulse: 66 69  Resp: 19 20  Temp:    SpO2: 96% 96%    Last Pain:  Vitals:   03/04/17 0915  TempSrc:   PainSc: 0-No pain                 Audry Pili

## 2017-03-04 NOTE — Anesthesia Procedure Notes (Signed)
Procedure Name: LMA Insertion Date/Time: 03/04/2017 7:26 AM Performed by: Marvelene Stoneberg D Pre-anesthesia Checklist: Patient identified, Emergency Drugs available, Suction available and Patient being monitored Patient Re-evaluated:Patient Re-evaluated prior to induction Oxygen Delivery Method: Circle system utilized Preoxygenation: Pre-oxygenation with 100% oxygen Induction Type: IV induction Ventilation: Mask ventilation without difficulty LMA: LMA inserted LMA Size: 3.0 Number of attempts: 1 Airway Equipment and Method: Bite block Placement Confirmation: positive ETCO2 Tube secured with: Tape Dental Injury: Teeth and Oropharynx as per pre-operative assessment

## 2017-03-04 NOTE — Op Note (Signed)
Patient Name:           Rachel Zimmerman   Date of Surgery:        03/04/2017  Pre op Diagnosis:      Ductal carcinoma in situ left breast, upper outer quadrant, high-grade, receptor positive  Post op Diagnosis:    Same  Procedure:                 Left breast lumpectomy with radioactive seed localization, reexcision medial margin, reexcision posterior margin  Surgeon:                     Edsel Petrin. Dalbert Batman, M.D., FACS  Assistant:                      OR staff   Indication for Assistant: N/A  Operative Indications:  This is a 56 year old female, referred by Dr. Marcelo Baldy at [facility] mammography for evaluation of a newly diagnosed localized area of ductal carcinoma in situ left breast, upper outer quadrant, high-grade. Dr. Donald Prose is her PCP. She was evaluated in the [facility]  by Dr. Lindi Adie, Dr. Lisbeth Renshaw, and me.      She has no prior breast problems.. Screening mammogram showed a small area of calcifications in the left breast at the 2:00 position, 7 cm from the nipple. Axillary ultrasound was negative. Image guided biopsy of the left breast upper outer quadrant showed high-grade DCIS and atypical lobular hyperplasia. ER 95%. PR 70%       It is notable that the biopsy clip is 2 cm away from the area of calcifications at target. The clip is lateral and anterior to the target. Family history reveals paternal aunt had breast cancer and survived. Mother deceased of gallbladder cancer. Had myasthenia. Father deceased of stroke hypertension diabetes.      We had a long talk about options including lumpectomy, mastectomy with or without reconstruction. She is most interested in lumpectomy and I think she is an excellent candidate for that. This will be followed by whole breast radiation therapy and antiestrogen therapy.      She'll be scheduled for left breast lumpectomy with radioactive seed localization. She agrees with this plan.  Operative Findings:       The calcifications and radioactive seed  were in the upper outer quadrant in this fairly deep lateral position.  The original biopsy clip had migrated away from the calcifications but the seed was felt to be within the area of ductal carcinoma in situ.  The radioactive seed appeared to be in the relative center of the specimen.  The old biopsy clip was off to one side.  I reexcised the medial margin and the posterior margin  Procedure in Detail:          Following the induction of general LMA anesthesia the patient's left breast was prepped and draped in a sterile fashion.  Surgical timeout was performed.  Intravenous antibiotics were given.  0.5% Marcaine with epinephrine was used as local infiltration anesthetic.      Using the neoprobe I identified an area at about the 2:30 position of the left breast, significantly deep and lateral.  I chose to make a curvilinear incision in this location which in general was parallel to the areolar margin.  The incision was made with the knife and the lumpectomy was performed with electrocautery and using the neoprobe frequently.  The specimen was removed and marked with a silk sutures and a  6 color ink kit to orient the pathologist.  Specimen mammogram looked good as mentioned above.  I questioned whether I might be somewhat close medially and posteriorly and so I reexcised those margins and sent those as separate specimens.      Hemostasis was achieved with electrocautery.  The wound was copiously irrigated.  I marked the lumpectomy cavity walls with 5 metal clips according to our protocol.  The breast tissues were closed in 2 separate layers with interrupted sutures of 3-0 Vicryl and the skin closed with a running subcuticular 4-0 Monocryl and Dermabond.  Breast binder was placed and the patient taken to PACU in stable condition.  EBL less than 20 mL.  Counts correct.  Complications none.     Edsel Petrin. Dalbert Batman, M.D., FACS General and Minimally Invasive Surgery Breast and Colorectal Surgery   Addendum:  I logged onto the [facility]  website and reviewed her prescription medication history  03/04/2017 8:27 AM

## 2017-03-04 NOTE — Discharge Instructions (Signed)
[facility] °Office Phone Number [phone] ° °BREAST BIOPSY/ PARTIAL MASTECTOMY: POST OP INSTRUCTIONS ° °Always review your discharge instruction sheet given to you by the facility where your surgery was performed. ° °IF YOU HAVE DISABILITY OR FAMILY LEAVE FORMS, YOU MUST BRING THEM TO THE OFFICE FOR PROCESSING.  DO NOT GIVE THEM TO YOUR DOCTOR. ° °1. A prescription for pain medication may be given to you upon discharge.  Take your pain medication as prescribed, if needed.  If narcotic pain medicine is not needed, then you may take acetaminophen (Tylenol) or ibuprofen (Advil) as needed. °2. Take your usually prescribed medications unless otherwise directed °3. If you need a refill on your pain medication, please contact your pharmacy.  They will contact our office to request authorization.  Prescriptions will not be filled after 5pm or on week-ends. °4. You should eat very light the first 24 hours after surgery, such as soup, crackers, pudding, etc.  Resume your normal diet the day after surgery. °5. Most patients will experience some swelling and bruising in the breast.  Ice packs and a good support bra will help.  Swelling and bruising can take several days to resolve.  °6. It is common to experience some constipation if taking pain medication after surgery.  Increasing fluid intake and taking a stool softener will usually help or prevent this problem from occurring.  A mild laxative (Milk of Magnesia or Miralax) should be taken according to package directions if there are no bowel movements after 48 hours. °7. Unless discharge instructions indicate otherwise, you may remove your bandages 24-48 hours after surgery, and you may shower at that time.  You may have steri-strips (small skin tapes) in place directly over the incision.  These strips should be left on the skin for 7-10 days.  If your surgeon used skin glue on the incision, you may shower in 24 hours.  The glue will flake off over the  next 2-3 weeks.  Any sutures or staples will be removed at the office during your follow-up visit. °8. ACTIVITIES:  You may resume regular daily activities (gradually increasing) beginning the next day.  Wearing a good support bra or sports bra minimizes pain and swelling.  You may have sexual intercourse when it is comfortable. °a. You may drive when you no longer are taking prescription pain medication, you can comfortably wear a seatbelt, and you can safely maneuver your car and apply brakes. °b. RETURN TO WORK:  ______________________________________________________________________________________ °9. You should see your doctor in the office for a follow-up appointment approximately two weeks after your surgery.  Your doctor’s nurse will typically make your follow-up appointment when she calls you with your pathology report.  Expect your pathology report 2-3 business days after your surgery.  You may call to check if you do not hear from us after three days. °10. OTHER INSTRUCTIONS: _______________________________________________________________________________________________ _____________________________________________________________________________________________________________________________________ °_____________________________________________________________________________________________________________________________________ °_____________________________________________________________________________________________________________________________________ ° °WHEN TO CALL YOUR DOCTOR: °1. Fever over 101.0 °2. Nausea and/or vomiting. °3. Extreme swelling or bruising. °4. Continued bleeding from incision. °5. Increased pain, redness, or drainage from the incision. ° °The clinic staff is available to answer your questions during regular business hours.  Please don’t hesitate to call and ask to speak to one of the nurses for clinical concerns.  If you have a medical emergency, go to the nearest  emergency room or call 911.  A surgeon from [facility] is always on call at the hospital. ° °For further questions, please visit [email or web address]  ° ° ° ° °  Post Anesthesia Home Care Instructions ° °Activity: °Get plenty of rest for the remainder of the day. A responsible individual must stay with you for 24 hours following the procedure.  °For the next 24 hours, DO NOT: °-Drive a car °-Operate machinery °-Drink alcoholic beverages °-Take any medication unless instructed by your physician °-Make any legal decisions or sign important papers. ° °Meals: °Start with liquid foods such as gelatin or soup. Progress to regular foods as tolerated. Avoid greasy, spicy, heavy foods. If nausea and/or vomiting occur, drink only clear liquids until the nausea and/or vomiting subsides. Call your physician if vomiting continues. ° °Special Instructions/Symptoms: °Your throat may feel dry or sore from the anesthesia or the breathing tube placed in your throat during surgery. If this causes discomfort, gargle with warm salt water. The discomfort should disappear within 24 hours. ° °If you had a scopolamine patch placed behind your ear for the management of post- operative nausea and/or vomiting: ° °1. The medication in the patch is effective for 72 hours, after which it should be removed.  Wrap patch in a tissue and discard in the trash. Wash hands thoroughly with soap and water. °2. You may remove the patch earlier than 72 hours if you experience unpleasant side effects which may include dry mouth, dizziness or visual disturbances. °3. Avoid touching the patch. Wash your hands with soap and water after contact with the patch. °  ° °

## 2017-03-08 ENCOUNTER — Encounter (HOSPITAL_BASED_OUTPATIENT_CLINIC_OR_DEPARTMENT_OTHER): Payer: Self-pay | Admitting: General Surgery

## 2017-03-14 ENCOUNTER — Ambulatory Visit (HOSPITAL_BASED_OUTPATIENT_CLINIC_OR_DEPARTMENT_OTHER): Payer: BLUE CROSS/BLUE SHIELD | Admitting: Hematology and Oncology

## 2017-03-14 ENCOUNTER — Encounter: Payer: Self-pay | Admitting: Hematology and Oncology

## 2017-03-14 DIAGNOSIS — Z170 Estrogen receptor positive status [ER+]: Secondary | ICD-10-CM

## 2017-03-14 DIAGNOSIS — D0512 Intraductal carcinoma in situ of left breast: Secondary | ICD-10-CM | POA: Diagnosis not present

## 2017-03-14 NOTE — Assessment & Plan Note (Signed)
03/04/2017: Left lumpectomy: DCIS with calcifications, high-grade, 1.1 cm,ALH, margins negative, ER 95%, PR 70%, Tis Nx stage 0  Pathology counseling: I discussed the final pathology report of the patient provided  a copy of this report. I discussed the margins. We also discussed the final staging along with previously performed ER/PR testing.  Recommendation: 1. Adjuvant radiation therapy 2. Followed by antiestrogen therapy with tamoxifen 5 years  Return to clinic towards end of radiation to discuss beginning antiestrogen therapy with tamoxifen.

## 2017-03-14 NOTE — Progress Notes (Signed)
Patient Care Team: Donald Prose, MD as PCP - General (Family Medicine) Fanny Skates, MD as Consulting Physician (General Surgery) Nicholas Lose, MD as Consulting Physician (Hematology and Oncology) Kyung Rudd, MD as Consulting Physician (Radiation Oncology)  DIAGNOSIS:  Encounter Diagnosis  Name Primary?  . Ductal carcinoma in situ (DCIS) of left breast     SUMMARY OF ONCOLOGIC HISTORY:   Ductal carcinoma in situ (DCIS) of left breast   01/17/2017 Initial Diagnosis    Screening detected left breast calcifications 6 mm span ultrasound axilla negative, biopsy high-grade DCIS with calcifications and necrosis plus ALH, ER 95%, PR 70%, Tis N0 stage 0      03/04/2017 Surgery    Left lumpectomy: DCIS with calcifications, high-grade, 1.1 cm,ALH, margins negative, ER 95%, PR 70%, Tis Nx stage 0       CHIEF COMPLIANT: Follow-up after recent lumpectomy for DCIS  INTERVAL HISTORY: Rachel Zimmerman is a 56 year old with above-mentioned history of left breast DCIS underwent lumpectomy and is here today to discuss the results of the surgery. She is healing fairly well from this injury. She denies any bleeding or discomfort.   REVIEW OF SYSTEMS:   Constitutional: Denies fevers, chills or abnormal weight loss Eyes: Denies blurriness of vision Ears, nose, mouth, throat, and face: Denies mucositis or sore throat Respiratory: Denies cough, dyspnea or wheezes Cardiovascular: Denies palpitation, chest discomfort Gastrointestinal:  Denies nausea, heartburn or change in bowel habits Skin: Denies abnormal skin rashes Lymphatics: Denies new lymphadenopathy or easy bruising Neurological:Denies numbness, tingling or new weaknesses Behavioral/Psych: Mood is stable, no new changes  Extremities: No lower extremity edema Breast:  Recent left lumpectomy All other systems were reviewed with the patient and are negative.  I have reviewed the past medical history, past surgical history, social history  and family history with the patient and they are unchanged from previous note.  ALLERGIES:  is allergic to benadryl [diphenhydramine hcl (sleep)].  MEDICATIONS:  Current Outpatient Prescriptions  Medication Sig Dispense Refill  . HYDROcodone-acetaminophen (NORCO) 5-325 MG tablet Take 1-2 tablets by mouth every 6 (six) hours as needed for moderate pain or severe pain. 30 tablet 0   No current facility-administered medications for this visit.     PHYSICAL EXAMINATION: ECOG PERFORMANCE STATUS: 1 - Symptomatic but completely ambulatory  Vitals:   03/14/17 1049  BP: 118/76  Pulse: 60  Resp: 18  Temp: 98.2 F (36.8 C)  SpO2: 100%   Filed Weights   03/14/17 1049  Weight: 198 lb 6.4 oz (90 kg)    GENERAL:alert, no distress and comfortable SKIN: skin color, texture, turgor are normal, no rashes or significant lesions EYES: normal, Conjunctiva are pink and non-injected, sclera clear OROPHARYNX:no exudate, no erythema and lips, buccal mucosa, and tongue normal  NECK: supple, thyroid normal size, non-tender, without nodularity LYMPH:  no palpable lymphadenopathy in the cervical, axillary or inguinal LUNGS: clear to auscultation and percussion with normal breathing effort HEART: regular rate & rhythm and no murmurs and no lower extremity edema ABDOMEN:abdomen soft, non-tender and normal bowel sounds MUSCULOSKELETAL:no cyanosis of digits and no clubbing  NEURO: alert & oriented x 3 with fluent speech, no focal motor/sensory deficits EXTREMITIES: No lower extremity edema  LABORATORY DATA:  I have reviewed the data as listed   Chemistry      Component Value Date/Time   NA 141 02/02/2017 1223   K 3.9 02/02/2017 1223   CO2 27 02/02/2017 1223   BUN 22.4 02/02/2017 1223   CREATININE 0.9 02/02/2017  1223      Component Value Date/Time   CALCIUM 9.9 02/02/2017 1223   ALKPHOS 67 02/02/2017 1223   AST 19 02/02/2017 1223   ALT 14 02/02/2017 1223   BILITOT 0.87 02/02/2017 1223        Lab Results  Component Value Date   WBC 6.4 02/02/2017   HGB 13.3 02/02/2017   HCT 40.9 02/02/2017   MCV 89.7 02/02/2017   PLT 195 02/02/2017   NEUTROABS 3.5 02/02/2017    ASSESSMENT & PLAN:  Ductal carcinoma in situ (DCIS) of left breast 03/04/2017: Left lumpectomy: DCIS with calcifications, high-grade, 1.1 cm,ALH, margins negative, ER 95%, PR 70%, Tis Nx stage 0  Pathology counseling: I discussed the final pathology report of the patient provided  a copy of this report. I discussed the margins. We also discussed the final staging along with previously performed ER/PR testing.  Recommendation: 1. Adjuvant radiation therapy 2. Followed by antiestrogen therapy with tamoxifen 5 years  Return to clinic towards end of radiation to discuss beginning antiestrogen therapy with tamoxifen.    I spent 25 minutes talking to the patient of which more than half was spent in counseling and coordination of care.  No orders of the defined types were placed in this encounter.  The patient has a good understanding of the overall plan. she agrees with it. she will call with any problems that may develop before the next visit here.   Rulon Eisenmenger, MD 03/14/17

## 2017-03-17 NOTE — Progress Notes (Signed)
Location of Breast Cancer:Ductal carcinoma in situ of left breast  Histology per Pathology Report: Diagnosis 03-04-17 1. Breast, lumpectomy, Left - DUCTAL CARCINOMA IN SITU WITH CALCIFICATIONS, HIGH GRADE, SPANNING 1.1 CM. - LOBULAR NEOPLASIA (ATYPICAL LOBULAR HYPERPLASIA). - THE SURGICAL RESECTION MARGINS ARE NEGATIVE FOR DUCTAL CARCINOMA. - SEE ONCOLOGY TABLE BELOW. 2. Breast, excision, Left new medial margin - BENIGN BREAST PARENCHYMA. - THERE IS NO EVIDENCE OF MALIGNANCY. - SEE COMMENT. 3. Breast, excision, Left new posterior margin - BENIGN BREAST PARENCHYMA. - THERE IS NO EVIDENCE OF MALIGNANCY   Receptor Status: ER(95% +), PR (70% +), Her2-neu (), Ki-()  Diagnosis 01-17-17 Breast, left, needle core biopsy, calcification, 2:30 o'clock, 7  - DUCTAL CARCINOMA IN SITU WITH CALCIFICATIONS. - LOBULAR NEOPLASIA (ATYPICAL LOBULAR HYPERPLASIA  Receptor Status: ER(95% +), PR (70% +), Her2-neu (), Ki-()  Did patient present with symptoms (if so, please note symptoms) or was this found on screening mammography?: Found to have calcifications on a screening mammogram in the left breast. Followed by diagnostic imaging and ultrasound.  Past/Anticipated interventions by surgeon, if any:03-04-17 Breast, lumpectomy Dr. Fanny Skates, Left, 01-17-17 Breast, left, needle core biopsy Fanny Skates,  Past/Anticipated interventions by medical oncology, if any: Chemotherapy Dr. Lindi Adie, Adjuvant radiation therapy, antiestrogen therapy with tamoxifen 5 years  Lymphedema issues, if any: No  Pain issues, if any: No  SAFETY ISSUES:  Prior radiation? : No  Pacemaker/ICD?:No  Possible current pregnancy? :No  Is the patient on methotrexate? :No  Current Complaints / other details: 22 y,o. married woman with two children family history of cancer maternal aunt had breast cancer. Menarche 20   G2 P2    BC No   Menopause 51     HRT No Wt Readings from Last 3 Encounters:  03/22/17 196 lb 6.4 oz  (89.1 kg)  03/14/17 198 lb 6.4 oz (90 kg)  03/04/17 195 lb (88.5 kg)  BP 121/84   Pulse 66   Temp 97.7 F (36.5 C) (Oral)   Resp 18   Ht '5\' 7"'  (1.702 m)   Wt 196 lb 6.4 oz (89.1 kg)   SpO2 100%   BMI 30.76 kg/m  Georgena Spurling, RN 03/17/2017,4:51 PM

## 2017-03-22 ENCOUNTER — Ambulatory Visit
Admission: RE | Admit: 2017-03-22 | Discharge: 2017-03-22 | Disposition: A | Discharge status: Home / Self Care | Payer: BLUE CROSS/BLUE SHIELD | Source: Ambulatory Visit | Attending: Radiation Oncology | Admitting: Radiation Oncology

## 2017-03-22 ENCOUNTER — Encounter: Payer: Self-pay | Admitting: Radiation Oncology

## 2017-03-22 VITALS — BP 121/84 | HR 66 | Temp 97.7°F | Resp 18 | Ht 67.0 in | Wt 196.4 lb

## 2017-03-22 DIAGNOSIS — D0512 Intraductal carcinoma in situ of left breast: Secondary | ICD-10-CM | POA: Diagnosis present

## 2017-03-22 DIAGNOSIS — Z510 Encounter for antineoplastic radiation therapy: Secondary | ICD-10-CM | POA: Diagnosis not present

## 2017-03-22 NOTE — Progress Notes (Signed)
Radiation Oncology         [phone] ________________________________  Name: Rachel Zimmerman        MRN: [id number]  Date of Service: 03/22/2017 DOB: March 21, 1961  CC:Sun, Gari Crown, MD  Nicholas Lose, MD     REFERRING PHYSICIAN: Nicholas Lose, MD   DIAGNOSIS: There were no encounter diagnoses.   HISTORY OF PRESENT ILLNESS: Rachel Zimmerman is a 56 y.o. female originally seen in the multidisciplinary breast clinic for a new diagnosis of left breast cancer. The patient was found to have calcifications on a screening mammogram in the left breast. Diagnostic imaging revealed a 6 mm area of calcifications, and her ultrasound did not reveal adenopathy in the axilla. 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   Since undergone right lumpectomy on 03/04/2017, and final pathology revealed high-grade DCIS with calcifications measuring 1.1 cm with atypical lobular hyperplasia. Her margins were negative by more than 2 mm.she met with Dr. Lindi Adie last week, and he has recommended antiestrogen therapy in the abdomen setting following her radiotherapy. She comes back to revisit the options for radiation treatment.  PREVIOUS RADIATION THERAPY: No   PAST MEDICAL HISTORY:  Past Medical History:  Diagnosis Date  . Menopause   . Vitamin D deficiency        PAST SURGICAL HISTORY: Past Surgical History:  Procedure Laterality Date  . APPENDECTOMY    . BREAST LUMPECTOMY WITH RADIOACTIVE SEED LOCALIZATION Left 03/04/2017   Procedure: BREAST LUMPECTOMY WITH RADIOACTIVE SEED LOCALIZATION;  Surgeon: Fanny Skates, MD;  Location: [facility];  Service: General;  Laterality: Left;  . CESAREAN SECTION     x 2  . COLONOSCOPY    . DILATATION & CURETTAGE/HYSTEROSCOPY WITH MYOSURE N/A 09/15/2016   Procedure: [place];  Surgeon: Thurnell Lose, MD;  Location: [facility];  Service:  Gynecology;  Laterality: N/A;  Ultrasound Guidance Myosure - Fibroids  . OPERATIVE ULTRASOUND N/A 09/15/2016   Procedure: OPERATIVE ULTRASOUND;  Surgeon: Thurnell Lose, MD;  Location: [facility];  Service: Gynecology;  Laterality: N/A;     FAMILY HISTORY:  Family History  Problem Relation Age of Onset  . Hypertension Father   . Kidney failure Father   . Diabetes Father   . Stroke Father        Deceased  . Hypercholesterolemia Mother   . Myasthenia gravis Mother        Living  . Breast cancer Maternal Aunt   . Healthy Brother   . Healthy Son        x 2     SOCIAL HISTORY:  reports that she has never smoked. She has never used smokeless tobacco. She reports that she drinks alcohol. She reports that she does not use drugs. The patient is married and lives in [place]. She works at [employer].    ALLERGIES: Benadryl [diphenhydramine hcl (sleep)]   MEDICATIONS:  Current Outpatient Prescriptions  Medication Sig Dispense Refill  . HYDROcodone-acetaminophen (NORCO) 5-325 MG tablet Take 1-2 tablets by mouth every 6 (six) hours as needed for moderate pain or severe pain. (Patient not taking: Reported on 03/22/2017) 30 tablet 0   No current facility-administered medications for this encounter.      REVIEW OF SYSTEMS: On review of systems, the patient reports she is doing pretty well overall. She is somewhat dissatisfied with the location of her lumpectomy scar however reports that she has not noticed any keloiding.  She is concerned about developing this, as well as the skin changes that could be longer lasting for radiotherapy. She reports that she has had some soreness in the upper part of the left chest wall since her surgery, but has not had any concerns of infection, fevers or chills, chest pain or shortness of breath. No new complaints are otherwise noted.    PHYSICAL EXAM:  Wt Readings from Last 3 Encounters:  03/22/17 196 lb 6.4 oz (89.1 kg)  03/14/17 198 lb 6.4 oz (90 kg)  03/04/17  195 lb (88.5 kg)   Temp Readings from Last 3 Encounters:  03/22/17 97.7 F (36.5 C) (Oral)  03/14/17 98.2 F (36.8 C) (Oral)  03/04/17 (!) 97.5 F (36.4 C)   BP Readings from Last 3 Encounters:  03/22/17 121/84  03/14/17 118/76  03/04/17 128/83   Pulse Readings from Last 3 Encounters:  03/22/17 66  03/14/17 60  03/04/17 67   Pain Assessment Pain Score: 0-No pain  In general this is a well appearing African American female in no acute distress. She's alert and oriented x4 and appropriate throughout the examination. Cardiopulmonary assessment is negative for acute distress and she exhibits normal effort. The left breast lumpectomy site is intact with no evidence of cellulitic streaking, bleeding or separation. Her scar is flat and does not appear to have any changes concerning for keloiding.  ECOG = 0  0 - Asymptomatic (Fully active, able to carry on all predisease activities without restriction)  1 - Symptomatic but completely ambulatory (Restricted in physically strenuous activity but ambulatory and able to carry out work of a light or sedentary nature. For example, light housework, office work)  2 - Symptomatic, <50% in bed during the day (Ambulatory and capable of all self care but unable to carry out any work activities. Up and about more than 50% of waking hours)  3 - Symptomatic, >50% in bed, but not bedbound (Capable of only limited self-care, confined to bed or chair 50% or more of waking hours)  4 - Bedbound (Completely disabled. Cannot carry on any self-care. Totally confined to bed or chair)  5 - Death   Eustace Pen MM, Creech RH, Tormey DC, et al. [phone]). "Toxicity and response criteria of the [facility]". [place] Oncol. 5 (6): 649-55    LABORATORY DATA:  Lab Results  Component Value Date   WBC 6.4 02/02/2017   HGB 13.3 02/02/2017   HCT 40.9 02/02/2017   MCV 89.7 02/02/2017   PLT 195 02/02/2017   Lab Results  Component Value Date     NA 141 02/02/2017   K 3.9 02/02/2017   CO2 27 02/02/2017   Lab Results  Component Value Date   ALT 14 02/02/2017   AST 19 02/02/2017   ALKPHOS 67 02/02/2017   BILITOT 0.87 02/02/2017      RADIOGRAPHY: No results found.     IMPRESSION/PLAN: 1. 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 We reviewed her surgical pathology and Dr. Lisbeth Renshaw recommends proceeding with adjuvant radiotherapy. We discussed the risks, benefits, short, and long term effects of radiotherapy, and the patient is interested in proceeding. Dr. Lisbeth Renshaw discusses the delivery and logistics of radiotherapy and would offer her a course of 6 1/2 weeks of treatment with deep inspiration breath hold technique. Written consent is obtained and placed in the chart, a copy was provided to the patient. She will return for simulation on Thursday at 9 AM.  In a visit lasting 25 minutes, greater than 50% of the time was spent face to face discussing discussion the role of radiotherapy, and coordinating the patient's care.  The above documentation reflects my direct findings during this shared patient visit. Please see the separate note by Dr. Lisbeth Renshaw on this date for the remainder of the patient's plan of care.    Carola Rhine, PAC

## 2017-03-22 NOTE — Addendum Note (Signed)
Encounter addended by: Malena Edman, RN on: 03/22/2017  3:47 PM<BR>    Actions taken: Charge Capture section accepted

## 2017-03-24 ENCOUNTER — Ambulatory Visit
Admission: RE | Admit: 2017-03-24 | Discharge: 2017-03-24 | Disposition: A | Discharge status: Home / Self Care | Payer: BLUE CROSS/BLUE SHIELD | Source: Ambulatory Visit | Attending: Radiation Oncology | Admitting: Radiation Oncology

## 2017-03-24 DIAGNOSIS — D0512 Intraductal carcinoma in situ of left breast: Secondary | ICD-10-CM

## 2017-03-24 DIAGNOSIS — Z510 Encounter for antineoplastic radiation therapy: Secondary | ICD-10-CM | POA: Diagnosis not present

## 2017-03-24 NOTE — Progress Notes (Signed)
  Radiation Oncology         [phone] ________________________________  Name: Rachel Zimmerman MRN: [id number]  Date: 03/24/2017  DOB: 1961/06/19  DIAGNOSIS:     ICD-10-CM   1. 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 The patient was placed in a supine position on a breast board. A customized vac-lock bag was constructed and this complex treatment device will be used on a daily basis during her treatment. In this fashion, a CT scan was obtained through the chest area and an isocenter was placed near the chest wall within the breast.  The patient will be planned to receive a course of radiation initially to a dose of 50.4 Gy. This will consist of a whole breast radiotherapy technique. To accomplish this, 2 customized blocks have been designed which will correspond to medial and lateral whole breast tangent fields. This treatment will be accomplished at 1.8 Gy per fraction. A forward planning technique will also be evaluated to determine if this approach improves the plan. It is anticipated that the patient will then receive a 10 Gy boost to the seroma cavity which has been contoured. This will be accomplished at 2 Gy per fraction.   This initial treatment will consist of a 3-D conformal technique. The seroma has been contoured as the primary target structure. Additionally, dose volume histograms of both this target as well as the lungs and heart will also be evaluated. Such an approach is necessary to ensure that the target area is adequately covered while the nearby critical  normal structures are adequately spared.  Plan:  The final anticipated total dose therefore will correspond to 60.4 Gy.    _______________________________   Jodelle Gross, MD, PhD

## 2017-03-24 NOTE — Progress Notes (Signed)
  Radiation Oncology         [phone]) [phone] ________________________________  Name: Rachel Zimmerman MRN: [id number]  Date: 03/24/2017  DOB: Nov 01, 1960  Optical Surface Tracking Plan:  Since intensity modulated radiotherapy (IMRT) and 3D conformal radiation treatment methods are predicated on accurate and precise positioning for treatment, intrafraction motion monitoring is medically necessary to ensure accurate and safe treatment delivery.  The ability to quantify intrafraction motion without excessive ionizing radiation dose can only be performed with optical surface tracking. Accordingly, surface imaging offers the opportunity to obtain 3D measurements of patient position throughout IMRT and 3D treatments without excessive radiation exposure.  I am ordering optical surface tracking for this patient's upcoming course of radiotherapy. ________________________________  Kyung Rudd, MD 03/24/2017 5:07 PM    Reference:   Particia Jasper, et al. Surface imaging-based analysis of intrafraction motion for breast radiotherapy patients.Journal of [place], n. 6, nov. 2014. ISSN [id number].   Available at: <http://www.jacmp.org/index.php/jacmp/article/view/4957>.

## 2017-03-29 ENCOUNTER — Ambulatory Visit: Payer: BLUE CROSS/BLUE SHIELD | Admitting: Radiation Oncology

## 2017-03-29 ENCOUNTER — Ambulatory Visit: Payer: BLUE CROSS/BLUE SHIELD

## 2017-03-29 DIAGNOSIS — Z510 Encounter for antineoplastic radiation therapy: Secondary | ICD-10-CM | POA: Diagnosis not present

## 2017-03-31 ENCOUNTER — Ambulatory Visit
Admission: RE | Admit: 2017-03-31 | Discharge: 2017-03-31 | Disposition: A | Discharge status: Home / Self Care | Payer: BLUE CROSS/BLUE SHIELD | Source: Ambulatory Visit | Attending: Radiation Oncology | Admitting: Radiation Oncology

## 2017-03-31 ENCOUNTER — Telehealth: Payer: Self-pay | Admitting: Hematology and Oncology

## 2017-03-31 DIAGNOSIS — Z510 Encounter for antineoplastic radiation therapy: Secondary | ICD-10-CM | POA: Diagnosis not present

## 2017-03-31 NOTE — Telephone Encounter (Signed)
Left voicemail for patient regarding the appts that were added per 9/26 sch msg.

## 2017-04-04 ENCOUNTER — Ambulatory Visit
Admission: RE | Admit: 2017-04-04 | Discharge: 2017-04-04 | Disposition: A | Discharge status: Home / Self Care | Payer: BLUE CROSS/BLUE SHIELD | Source: Ambulatory Visit | Attending: Radiation Oncology | Admitting: Radiation Oncology

## 2017-04-04 DIAGNOSIS — Z510 Encounter for antineoplastic radiation therapy: Secondary | ICD-10-CM | POA: Diagnosis not present

## 2017-04-04 DIAGNOSIS — D0512 Intraductal carcinoma in situ of left breast: Secondary | ICD-10-CM

## 2017-04-04 MED ORDER — ALRA NON-METALLIC DEODORANT (RAD-ONC)
1.0000 "application " | Freq: Once | TOPICAL | Status: AC
  Administered 2017-04-04: 1 via TOPICAL

## 2017-04-04 MED ORDER — RADIAPLEXRX EX GEL
Freq: Once | CUTANEOUS | Status: AC
  Administered 2017-04-04: 13:00:00 via TOPICAL

## 2017-04-04 NOTE — Progress Notes (Signed)
Pt education done, radiation therapy and you book, alra deodorant, radiaplex gel cream ,my business card given, discussed skin irritation,pain,fatigue with the patient, exercise daily, increase protein in diet, drink plenty water,stay hydrated, when showering, no rubbing,scrubbing or scratching breast area, pat dry, apply radiaplex after rad tx and at bedtime, alra after rad tx and prn, nothing to breat area 4 hours before radiation, no under wire bras, electric shaver only if shaving under arm,, get plenty rest and sleep,  teach back given,  12:49 PM

## 2017-04-05 ENCOUNTER — Ambulatory Visit
Admission: RE | Admit: 2017-04-05 | Discharge: 2017-04-05 | Disposition: A | Discharge status: Home / Self Care | Payer: BLUE CROSS/BLUE SHIELD | Source: Ambulatory Visit | Attending: Radiation Oncology | Admitting: Radiation Oncology

## 2017-04-05 DIAGNOSIS — Z510 Encounter for antineoplastic radiation therapy: Secondary | ICD-10-CM | POA: Diagnosis not present

## 2017-04-06 ENCOUNTER — Ambulatory Visit
Admission: RE | Admit: 2017-04-06 | Discharge: 2017-04-06 | Disposition: A | Discharge status: Home / Self Care | Payer: BLUE CROSS/BLUE SHIELD | Source: Ambulatory Visit | Attending: Radiation Oncology | Admitting: Radiation Oncology

## 2017-04-06 DIAGNOSIS — Z510 Encounter for antineoplastic radiation therapy: Secondary | ICD-10-CM | POA: Diagnosis not present

## 2017-04-07 ENCOUNTER — Ambulatory Visit
Admission: RE | Admit: 2017-04-07 | Discharge: 2017-04-07 | Disposition: A | Discharge status: Home / Self Care | Payer: BLUE CROSS/BLUE SHIELD | Source: Ambulatory Visit | Attending: Radiation Oncology | Admitting: Radiation Oncology

## 2017-04-07 DIAGNOSIS — Z510 Encounter for antineoplastic radiation therapy: Secondary | ICD-10-CM | POA: Diagnosis not present

## 2017-04-08 ENCOUNTER — Ambulatory Visit
Admission: RE | Admit: 2017-04-08 | Discharge: 2017-04-08 | Disposition: A | Discharge status: Home / Self Care | Payer: BLUE CROSS/BLUE SHIELD | Source: Ambulatory Visit | Attending: Radiation Oncology | Admitting: Radiation Oncology

## 2017-04-08 DIAGNOSIS — Z510 Encounter for antineoplastic radiation therapy: Secondary | ICD-10-CM | POA: Diagnosis not present

## 2017-04-11 ENCOUNTER — Ambulatory Visit
Admission: RE | Admit: 2017-04-11 | Discharge: 2017-04-11 | Disposition: A | Discharge status: Home / Self Care | Payer: BLUE CROSS/BLUE SHIELD | Source: Ambulatory Visit | Attending: Radiation Oncology | Admitting: Radiation Oncology

## 2017-04-11 DIAGNOSIS — Z510 Encounter for antineoplastic radiation therapy: Secondary | ICD-10-CM | POA: Diagnosis not present

## 2017-04-12 ENCOUNTER — Ambulatory Visit
Admission: RE | Admit: 2017-04-12 | Discharge: 2017-04-12 | Disposition: A | Discharge status: Home / Self Care | Payer: BLUE CROSS/BLUE SHIELD | Source: Ambulatory Visit | Attending: Radiation Oncology | Admitting: Radiation Oncology

## 2017-04-12 DIAGNOSIS — Z510 Encounter for antineoplastic radiation therapy: Secondary | ICD-10-CM | POA: Diagnosis not present

## 2017-04-13 ENCOUNTER — Ambulatory Visit
Admission: RE | Admit: 2017-04-13 | Discharge: 2017-04-13 | Disposition: A | Discharge status: Home / Self Care | Payer: BLUE CROSS/BLUE SHIELD | Source: Ambulatory Visit | Attending: Radiation Oncology | Admitting: Radiation Oncology

## 2017-04-13 DIAGNOSIS — Z510 Encounter for antineoplastic radiation therapy: Secondary | ICD-10-CM | POA: Diagnosis not present

## 2017-04-14 ENCOUNTER — Ambulatory Visit
Admission: RE | Admit: 2017-04-14 | Discharge: 2017-04-14 | Disposition: A | Discharge status: Home / Self Care | Payer: BLUE CROSS/BLUE SHIELD | Source: Ambulatory Visit | Attending: Radiation Oncology | Admitting: Radiation Oncology

## 2017-04-14 DIAGNOSIS — Z510 Encounter for antineoplastic radiation therapy: Secondary | ICD-10-CM | POA: Diagnosis not present

## 2017-04-15 ENCOUNTER — Ambulatory Visit
Admission: RE | Admit: 2017-04-15 | Discharge: 2017-04-15 | Disposition: A | Discharge status: Home / Self Care | Payer: BLUE CROSS/BLUE SHIELD | Source: Ambulatory Visit | Attending: Radiation Oncology | Admitting: Radiation Oncology

## 2017-04-15 DIAGNOSIS — Z510 Encounter for antineoplastic radiation therapy: Secondary | ICD-10-CM | POA: Diagnosis not present

## 2017-04-18 ENCOUNTER — Ambulatory Visit
Admission: RE | Admit: 2017-04-18 | Discharge: 2017-04-18 | Disposition: A | Discharge status: Home / Self Care | Payer: BLUE CROSS/BLUE SHIELD | Source: Ambulatory Visit | Attending: Radiation Oncology | Admitting: Radiation Oncology

## 2017-04-18 DIAGNOSIS — Z510 Encounter for antineoplastic radiation therapy: Secondary | ICD-10-CM | POA: Diagnosis not present

## 2017-04-19 ENCOUNTER — Ambulatory Visit
Admission: RE | Admit: 2017-04-19 | Discharge: 2017-04-19 | Disposition: A | Discharge status: Home / Self Care | Payer: BLUE CROSS/BLUE SHIELD | Source: Ambulatory Visit | Attending: Radiation Oncology | Admitting: Radiation Oncology

## 2017-04-19 DIAGNOSIS — Z510 Encounter for antineoplastic radiation therapy: Secondary | ICD-10-CM | POA: Diagnosis not present

## 2017-04-20 ENCOUNTER — Ambulatory Visit
Admission: RE | Admit: 2017-04-20 | Discharge: 2017-04-20 | Disposition: A | Discharge status: Home / Self Care | Payer: BLUE CROSS/BLUE SHIELD | Source: Ambulatory Visit | Attending: Radiation Oncology | Admitting: Radiation Oncology

## 2017-04-20 DIAGNOSIS — Z510 Encounter for antineoplastic radiation therapy: Secondary | ICD-10-CM | POA: Diagnosis not present

## 2017-04-21 ENCOUNTER — Ambulatory Visit
Admission: RE | Admit: 2017-04-21 | Discharge: 2017-04-21 | Disposition: A | Discharge status: Home / Self Care | Payer: BLUE CROSS/BLUE SHIELD | Source: Ambulatory Visit | Attending: Radiation Oncology | Admitting: Radiation Oncology

## 2017-04-21 DIAGNOSIS — Z510 Encounter for antineoplastic radiation therapy: Secondary | ICD-10-CM | POA: Diagnosis not present

## 2017-04-22 ENCOUNTER — Ambulatory Visit
Admission: RE | Admit: 2017-04-22 | Discharge: 2017-04-22 | Disposition: A | Discharge status: Home / Self Care | Payer: BLUE CROSS/BLUE SHIELD | Source: Ambulatory Visit | Attending: Radiation Oncology | Admitting: Radiation Oncology

## 2017-04-22 DIAGNOSIS — Z510 Encounter for antineoplastic radiation therapy: Secondary | ICD-10-CM | POA: Diagnosis not present

## 2017-04-25 ENCOUNTER — Ambulatory Visit
Admission: RE | Admit: 2017-04-25 | Discharge: 2017-04-25 | Disposition: A | Discharge status: Home / Self Care | Payer: BLUE CROSS/BLUE SHIELD | Source: Ambulatory Visit | Attending: Radiation Oncology | Admitting: Radiation Oncology

## 2017-04-25 DIAGNOSIS — Z510 Encounter for antineoplastic radiation therapy: Secondary | ICD-10-CM | POA: Diagnosis not present

## 2017-04-26 ENCOUNTER — Ambulatory Visit
Admission: RE | Admit: 2017-04-26 | Discharge: 2017-04-26 | Disposition: A | Discharge status: Home / Self Care | Payer: BLUE CROSS/BLUE SHIELD | Source: Ambulatory Visit | Attending: Radiation Oncology | Admitting: Radiation Oncology

## 2017-04-26 DIAGNOSIS — Z510 Encounter for antineoplastic radiation therapy: Secondary | ICD-10-CM | POA: Diagnosis not present

## 2017-04-27 ENCOUNTER — Ambulatory Visit
Admission: RE | Admit: 2017-04-27 | Discharge: 2017-04-27 | Disposition: A | Discharge status: Home / Self Care | Payer: BLUE CROSS/BLUE SHIELD | Source: Ambulatory Visit | Attending: Radiation Oncology | Admitting: Radiation Oncology

## 2017-04-27 DIAGNOSIS — Z510 Encounter for antineoplastic radiation therapy: Secondary | ICD-10-CM | POA: Diagnosis not present

## 2017-04-28 ENCOUNTER — Ambulatory Visit
Admission: RE | Admit: 2017-04-28 | Discharge: 2017-04-28 | Disposition: A | Discharge status: Home / Self Care | Payer: BLUE CROSS/BLUE SHIELD | Source: Ambulatory Visit | Attending: Radiation Oncology | Admitting: Radiation Oncology

## 2017-04-28 DIAGNOSIS — Z510 Encounter for antineoplastic radiation therapy: Secondary | ICD-10-CM | POA: Diagnosis not present

## 2017-04-29 ENCOUNTER — Ambulatory Visit
Admission: RE | Admit: 2017-04-29 | Discharge: 2017-04-29 | Disposition: A | Discharge status: Home / Self Care | Payer: BLUE CROSS/BLUE SHIELD | Source: Ambulatory Visit | Attending: Radiation Oncology | Admitting: Radiation Oncology

## 2017-04-29 DIAGNOSIS — Z510 Encounter for antineoplastic radiation therapy: Secondary | ICD-10-CM | POA: Diagnosis not present

## 2017-05-02 ENCOUNTER — Ambulatory Visit
Admission: RE | Admit: 2017-05-02 | Discharge: 2017-05-02 | Disposition: A | Discharge status: Home / Self Care | Payer: BLUE CROSS/BLUE SHIELD | Source: Ambulatory Visit | Attending: Radiation Oncology | Admitting: Radiation Oncology

## 2017-05-02 DIAGNOSIS — Z510 Encounter for antineoplastic radiation therapy: Secondary | ICD-10-CM | POA: Diagnosis not present

## 2017-05-03 ENCOUNTER — Ambulatory Visit
Admission: RE | Admit: 2017-05-03 | Discharge: 2017-05-03 | Disposition: A | Discharge status: Home / Self Care | Payer: BLUE CROSS/BLUE SHIELD | Source: Ambulatory Visit | Attending: Radiation Oncology | Admitting: Radiation Oncology

## 2017-05-03 DIAGNOSIS — Z510 Encounter for antineoplastic radiation therapy: Secondary | ICD-10-CM | POA: Insufficient documentation

## 2017-05-03 DIAGNOSIS — D0582 Other specified type of carcinoma in situ of left breast: Secondary | ICD-10-CM | POA: Insufficient documentation

## 2017-05-04 ENCOUNTER — Ambulatory Visit
Admission: RE | Admit: 2017-05-04 | Discharge: 2017-05-04 | Disposition: A | Discharge status: Home / Self Care | Payer: BLUE CROSS/BLUE SHIELD | Source: Ambulatory Visit | Attending: Radiation Oncology | Admitting: Radiation Oncology

## 2017-05-04 DIAGNOSIS — Z510 Encounter for antineoplastic radiation therapy: Secondary | ICD-10-CM | POA: Diagnosis not present

## 2017-05-05 ENCOUNTER — Ambulatory Visit
Admission: RE | Admit: 2017-05-05 | Discharge: 2017-05-05 | Disposition: A | Discharge status: Home / Self Care | Payer: BLUE CROSS/BLUE SHIELD | Source: Ambulatory Visit | Attending: Radiation Oncology | Admitting: Radiation Oncology

## 2017-05-05 DIAGNOSIS — Z510 Encounter for antineoplastic radiation therapy: Secondary | ICD-10-CM | POA: Diagnosis not present

## 2017-05-06 ENCOUNTER — Ambulatory Visit: Payer: BLUE CROSS/BLUE SHIELD | Admitting: Radiation Oncology

## 2017-05-06 ENCOUNTER — Ambulatory Visit
Admission: RE | Admit: 2017-05-06 | Discharge: 2017-05-06 | Disposition: A | Discharge status: Home / Self Care | Payer: BLUE CROSS/BLUE SHIELD | Source: Ambulatory Visit | Attending: Radiation Oncology | Admitting: Radiation Oncology

## 2017-05-06 DIAGNOSIS — Z510 Encounter for antineoplastic radiation therapy: Secondary | ICD-10-CM | POA: Diagnosis not present

## 2017-05-09 ENCOUNTER — Ambulatory Visit
Admission: RE | Admit: 2017-05-09 | Discharge: 2017-05-09 | Disposition: A | Discharge status: Home / Self Care | Payer: BLUE CROSS/BLUE SHIELD | Source: Ambulatory Visit | Attending: Radiation Oncology | Admitting: Radiation Oncology

## 2017-05-09 DIAGNOSIS — Z510 Encounter for antineoplastic radiation therapy: Secondary | ICD-10-CM | POA: Diagnosis not present

## 2017-05-10 ENCOUNTER — Ambulatory Visit
Admission: RE | Admit: 2017-05-10 | Discharge: 2017-05-10 | Disposition: A | Discharge status: Home / Self Care | Payer: BLUE CROSS/BLUE SHIELD | Source: Ambulatory Visit | Attending: Radiation Oncology | Admitting: Radiation Oncology

## 2017-05-10 DIAGNOSIS — Z510 Encounter for antineoplastic radiation therapy: Secondary | ICD-10-CM | POA: Diagnosis not present

## 2017-05-11 ENCOUNTER — Ambulatory Visit
Admission: RE | Admit: 2017-05-11 | Discharge: 2017-05-11 | Disposition: A | Discharge status: Home / Self Care | Payer: BLUE CROSS/BLUE SHIELD | Source: Ambulatory Visit | Attending: Radiation Oncology | Admitting: Radiation Oncology

## 2017-05-11 DIAGNOSIS — Z510 Encounter for antineoplastic radiation therapy: Secondary | ICD-10-CM | POA: Diagnosis not present

## 2017-05-12 ENCOUNTER — Ambulatory Visit
Admission: RE | Admit: 2017-05-12 | Discharge: 2017-05-12 | Disposition: A | Discharge status: Home / Self Care | Payer: BLUE CROSS/BLUE SHIELD | Source: Ambulatory Visit | Attending: Radiation Oncology | Admitting: Radiation Oncology

## 2017-05-12 DIAGNOSIS — Z510 Encounter for antineoplastic radiation therapy: Secondary | ICD-10-CM | POA: Diagnosis not present

## 2017-05-13 ENCOUNTER — Ambulatory Visit
Admission: RE | Admit: 2017-05-13 | Discharge: 2017-05-13 | Disposition: A | Discharge status: Home / Self Care | Payer: BLUE CROSS/BLUE SHIELD | Source: Ambulatory Visit | Attending: Radiation Oncology | Admitting: Radiation Oncology

## 2017-05-13 DIAGNOSIS — Z510 Encounter for antineoplastic radiation therapy: Secondary | ICD-10-CM | POA: Diagnosis not present

## 2017-05-16 ENCOUNTER — Ambulatory Visit
Admission: RE | Admit: 2017-05-16 | Discharge: 2017-05-16 | Disposition: A | Discharge status: Home / Self Care | Payer: BLUE CROSS/BLUE SHIELD | Source: Ambulatory Visit | Attending: Radiation Oncology | Admitting: Radiation Oncology

## 2017-05-16 DIAGNOSIS — D0512 Intraductal carcinoma in situ of left breast: Secondary | ICD-10-CM

## 2017-05-16 DIAGNOSIS — Z510 Encounter for antineoplastic radiation therapy: Secondary | ICD-10-CM | POA: Diagnosis not present

## 2017-05-16 MED ORDER — RADIAPLEXRX EX GEL
Freq: Once | CUTANEOUS | Status: AC
  Administered 2017-05-16: 10:00:00 via TOPICAL

## 2017-05-17 ENCOUNTER — Ambulatory Visit
Admission: RE | Admit: 2017-05-17 | Discharge: 2017-05-17 | Disposition: A | Discharge status: Home / Self Care | Payer: BLUE CROSS/BLUE SHIELD | Source: Ambulatory Visit | Attending: Radiation Oncology | Admitting: Radiation Oncology

## 2017-05-17 DIAGNOSIS — Z510 Encounter for antineoplastic radiation therapy: Secondary | ICD-10-CM | POA: Diagnosis not present

## 2017-05-18 ENCOUNTER — Ambulatory Visit (HOSPITAL_BASED_OUTPATIENT_CLINIC_OR_DEPARTMENT_OTHER): Payer: BLUE CROSS/BLUE SHIELD | Admitting: Hematology and Oncology

## 2017-05-18 ENCOUNTER — Telehealth: Payer: Self-pay | Admitting: Hematology and Oncology

## 2017-05-18 ENCOUNTER — Encounter: Payer: Self-pay | Admitting: Radiation Oncology

## 2017-05-18 ENCOUNTER — Ambulatory Visit
Admission: RE | Admit: 2017-05-18 | Discharge: 2017-05-18 | Disposition: A | Discharge status: Home / Self Care | Payer: BLUE CROSS/BLUE SHIELD | Source: Ambulatory Visit | Attending: Radiation Oncology | Admitting: Radiation Oncology

## 2017-05-18 DIAGNOSIS — Z170 Estrogen receptor positive status [ER+]: Secondary | ICD-10-CM

## 2017-05-18 DIAGNOSIS — D0512 Intraductal carcinoma in situ of left breast: Secondary | ICD-10-CM

## 2017-05-18 DIAGNOSIS — Z510 Encounter for antineoplastic radiation therapy: Secondary | ICD-10-CM | POA: Diagnosis not present

## 2017-05-18 MED ORDER — TAMOXIFEN CITRATE 20 MG PO TABS: 20.0000 mg | ORAL_TABLET | Freq: Every day | ORAL | 3 refills | Status: DC

## 2017-05-18 NOTE — Progress Notes (Signed)
Patient Care Team: Donald Prose, MD as PCP - General (Family Medicine) Fanny Skates, MD as Consulting Physician (General Surgery) Nicholas Lose, MD as Consulting Physician (Hematology and Oncology) Kyung Rudd, MD as Consulting Physician (Radiation Oncology)  DIAGNOSIS:  Encounter Diagnosis  Name Primary?  . Ductal carcinoma in situ (DCIS) of left breast     SUMMARY OF ONCOLOGIC HISTORY:   Ductal carcinoma in situ (DCIS) of left breast   01/17/2017 Initial Diagnosis    Screening detected left breast calcifications 6 mm span ultrasound axilla negative, biopsy high-grade DCIS with calcifications and necrosis plus ALH, ER 95%, PR 70%, Tis N0 stage 0      03/04/2017 Surgery    Left lumpectomy: DCIS with calcifications, high-grade, 1.1 cm,ALH, margins negative, ER 95%, PR 70%, Tis Nx stage 0      04/04/2017 - 05/18/2017 Radiation Therapy    Adjuvant radiation therapy       CHIEF COMPLIANT: Follow-up after radiation therapy is complete  INTERVAL HISTORY: Rachel Zimmerman is a 56 year old with above-mentioned history of left breast DCIS who underwent radiation the next steps and adjuvant treatment time.  She tolerated radiation fairly well.  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  REVIEW OF SYSTEMS:   Constitutional: Denies fevers, chills or abnormal weight loss Eyes: Denies blurriness of vision Ears, nose, mouth, throat, and face: Denies mucositis or sore throat Respiratory: Denies cough, dyspnea or wheezes Cardiovascular: Denies palpitation, chest discomfort Gastrointestinal:  Denies nausea, heartburn or change in bowel habits Skin: Denies abnormal skin rashes Lymphatics: Denies new lymphadenopathy or easy bruising Neurological:Denies numbness, tingling or new weaknesses Behavioral/Psych: Mood is stable, no new changes  Extremities: No lower extremity edema Breast: Radiation dermatitis All other systems were reviewed with the patient and are negative.  I have reviewed  the past medical history, past surgical history, social history and family history with the patient and they are unchanged from previous note.  ALLERGIES:  is allergic to benadryl [diphenhydramine hcl (sleep)].  MEDICATIONS:  Current Outpatient Medications  Medication Sig Dispense Refill  . hyaluronate sodium (RADIAPLEXRX) GEL Apply 1 application topically 2 (two) times daily. Apply to breast after rad x and bedtime    . [START ON 07/05/2017] tamoxifen (NOLVADEX) 20 MG tablet Take 1 tablet (20 mg total) daily by mouth. 90 tablet 3   No current facility-administered medications for this visit.     PHYSICAL EXAMINATION: ECOG PERFORMANCE STATUS: 1 - Symptomatic but completely ambulatory  Vitals:   05/18/17 1020  BP: 130/71  Pulse: 71  Resp: 18  Temp: 98.6 F (37 C)  SpO2: 100%   Filed Weights   05/18/17 1020  Weight: 199 lb (90.3 kg)    GENERAL:alert, no distress and comfortable SKIN: skin color, texture, turgor are normal, no rashes or significant lesions EYES: normal, Conjunctiva are pink and non-injected, sclera clear OROPHARYNX:no exudate, no erythema and lips, buccal mucosa, and tongue normal  NECK: supple, thyroid normal size, non-tender, without nodularity LYMPH:  no palpable lymphadenopathy in the cervical, axillary or inguinal LUNGS: clear to auscultation and percussion with normal breathing effort HEART: regular rate & rhythm and no murmurs and no lower extremity edema ABDOMEN:abdomen soft, non-tender and normal bowel sounds MUSCULOSKELETAL:no cyanosis of digits and no clubbing  NEURO: alert & oriented x 3 with fluent speech, no focal motor/sensory deficits EXTREMITIES: No lower extremity edema   LABORATORY DATA:  I have reviewed the data as listed   Chemistry      Component Value Date/Time  NA 141 02/02/2017 1223   K 3.9 02/02/2017 1223   CO2 27 02/02/2017 1223   BUN 22.4 02/02/2017 1223   CREATININE 0.9 02/02/2017 1223      Component Value Date/Time    CALCIUM 9.9 02/02/2017 1223   ALKPHOS 67 02/02/2017 1223   AST 19 02/02/2017 1223   ALT 14 02/02/2017 1223   BILITOT 0.87 02/02/2017 1223       Lab Results  Component Value Date   WBC 6.4 02/02/2017   HGB 13.3 02/02/2017   HCT 40.9 02/02/2017   MCV 89.7 02/02/2017   PLT 195 02/02/2017   NEUTROABS 3.5 02/02/2017    ASSESSMENT & PLAN:  Ductal carcinoma in situ (DCIS) of left breast 03/04/2017: Left lumpectomy: DCIS with calcifications, high-grade, 1.1 cm,ALH, margins negative, ER 95%, PR 70%, Tis Nx stage 0  Adjuvant radiation 04/04/2017-05/18/2017 Recommendation: Adjuvant antiestrogen therapy with tamoxifen 20 mg daily for 5 years, which she will start July 05, 2017  Tamoxifen counseling: We discussed the risks and benefits of tamoxifen. These include but not limited to insomnia, hot flashes, mood changes, vaginal dryness, and weight gain. Although rare, serious side effects including endometrial cancer, risk of blood clots were also discussed. We strongly believe that the benefits far outweigh the risks. Patient understands these risks and consented to starting treatment. Planned treatment duration is 5 years.  Patient is concerned about heavy uterine bleeding as she has had problems with that in the past and required a D&C. She is also worried about anastrozole related bone loss as she has already had a fracture previously. Based on risks and benefits of believe tamoxifen is the best treatment for her.  Return to clinic in 4 months for survivorship care plan visit  I spent 25 minutes talking to the patient of which more than half was spent in counseling and coordination of care.  No orders of the defined types were placed in this encounter.  The patient has a good understanding of the overall plan. she agrees with it. she will call with any problems that may develop before the next visit here.   Rulon Eisenmenger, MD 05/18/17

## 2017-05-18 NOTE — Telephone Encounter (Signed)
Gave patient avs and calendar with appts per 11/14 los.  °

## 2017-05-18 NOTE — Assessment & Plan Note (Signed)
03/04/2017: Left lumpectomy: DCIS with calcifications, high-grade, 1.1 cm,ALH, margins negative, ER 95%, PR 70%, Tis Nx stage 0  Adjuvant radiation 04/04/2017-05/18/2017 Recommendation: Adjuvant antiestrogen therapy with tamoxifen 20 mg daily for 5 years  We discussed the risks and benefits of tamoxifen. These include but not limited to insomnia, hot flashes, mood changes, vaginal dryness, and weight gain. Although rare, serious side effects including endometrial cancer, risk of blood clots were also discussed. We strongly believe that the benefits far outweigh the risks. Patient understands these risks and consented to starting treatment. Planned treatment duration is 5 years.  Return to clinic in 3 months for survivorship care plan visit

## 2017-05-19 ENCOUNTER — Encounter: Payer: Self-pay | Admitting: *Deleted

## 2017-05-24 NOTE — Progress Notes (Signed)
  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     Indication for treatment:  Curative       Radiation treatment dates:   04/04/2017 to 05/18/2017  Site/dose:    1. The Left breast was treated to 50.4 Gy in 28 fractions of 1.8 Gy. 2. The Left breast was boosted to 10 Gy in 5 fractions of 2 Gy.  Beams/energy:    1. 3D // 10X, 6X 2. Photon boost // 10X, 6X  Narrative: The patient tolerated radiation treatment relatively well. She developed hyperpigmentation diffusely in the treatment area with dry desquamation in the axilla and the inframammary region. She experienced some skin irritation but no major complaints. Using skin cream daily.   Plan: The patient has completed radiation treatment. The patient will return to radiation oncology clinic for routine followup in one month. I advised them to call or return sooner if they have any questions or concerns related to their recovery or treatment.  ------------------------------------------------  Jodelle Gross, MD, PhD  This document serves as a record of services personally performed by Kyung Rudd, MD. It was created on his behalf by Arlyce Harman, a trained medical scribe. The creation of this record is based on the scribe's personal observations and the provider's statements to them. This document has been checked and approved by the attending provider.

## 2017-06-10 ENCOUNTER — Telehealth: Payer: Self-pay | Admitting: *Deleted

## 2017-06-10 ENCOUNTER — Telehealth: Payer: Self-pay

## 2017-06-10 NOTE — Telephone Encounter (Signed)
Returned pt call concerning slight chest tightness that started a few days ago. Pt described it not as pain but she can feel the tightness when she moves. She finished radiation on November the 14th. I explained to her it sounded like side effects from radiation. She has not taken anything for this feeling and does not feel the need to. Encouraged her to keep her skin moisturized and if the tightness continues or gets worse to contact the Radiation department.  Cyndia Bent RN

## 2017-06-10 NOTE — Telephone Encounter (Signed)
Returned call to patient voice message left voice message, patient had left message some tightness in her chest  And mild pain,  She also called medical oncologist Dr. Geralyn Flash nurse, I left vm if continues to have chest tighness, sweating(diaphoresuis) ,nuasea,vomiting, go straight  to the Emergency room, she had completed radiation to left breast 05/18/17,she can call back today I will be here until 5pm 4:36 PM

## 2017-07-17 IMAGING — CR DG ANKLE COMPLETE 3+V*L*
3 series · 3 of 3 positions shown · non-contrast
Comparison: None.

CLINICAL DATA: Left ankle pain, lateral predominant.

EXAM:
LEFT ANKLE COMPLETE - 3+ VIEW

[x ankle ap left]
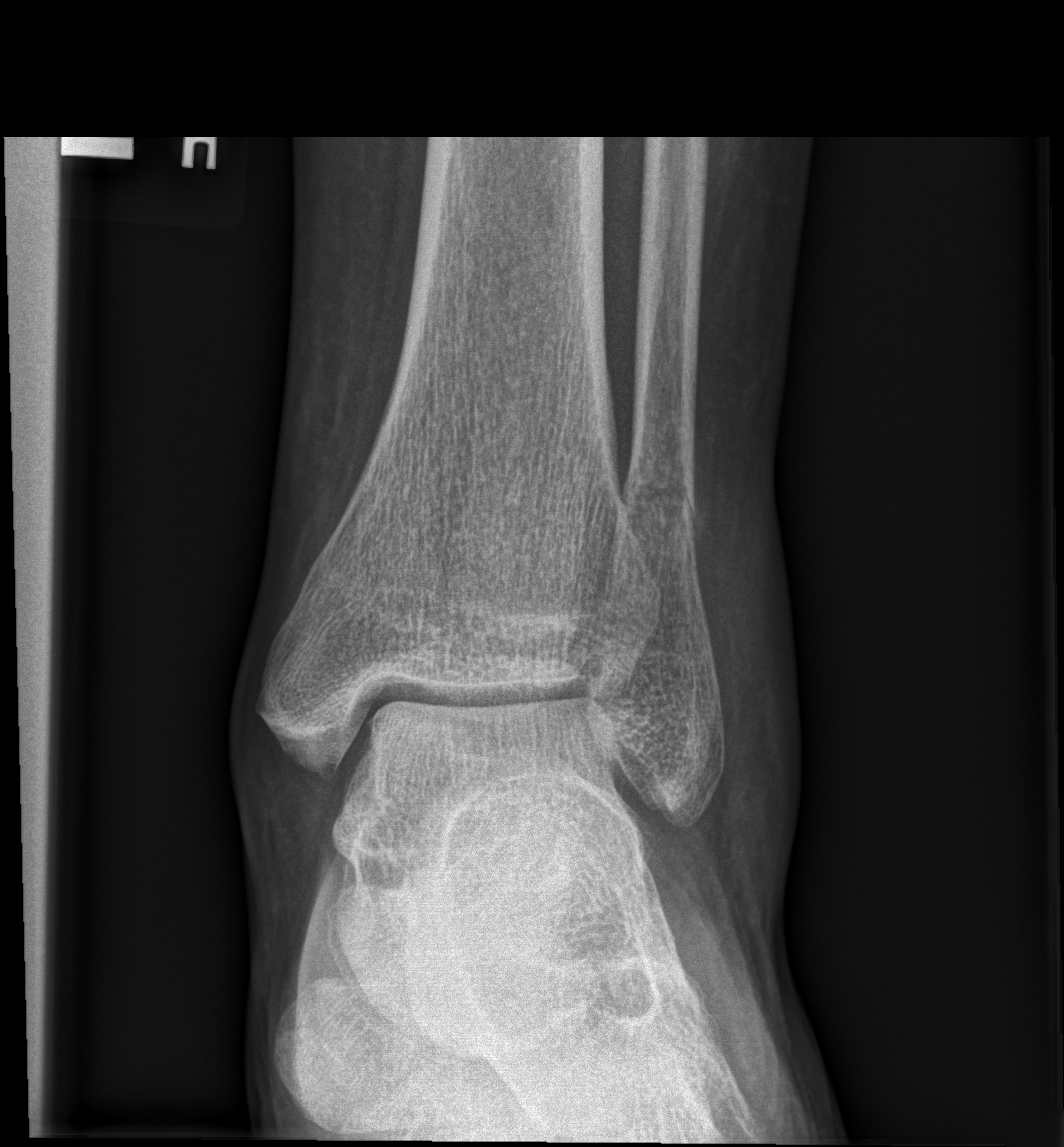

[x ankle obl left]
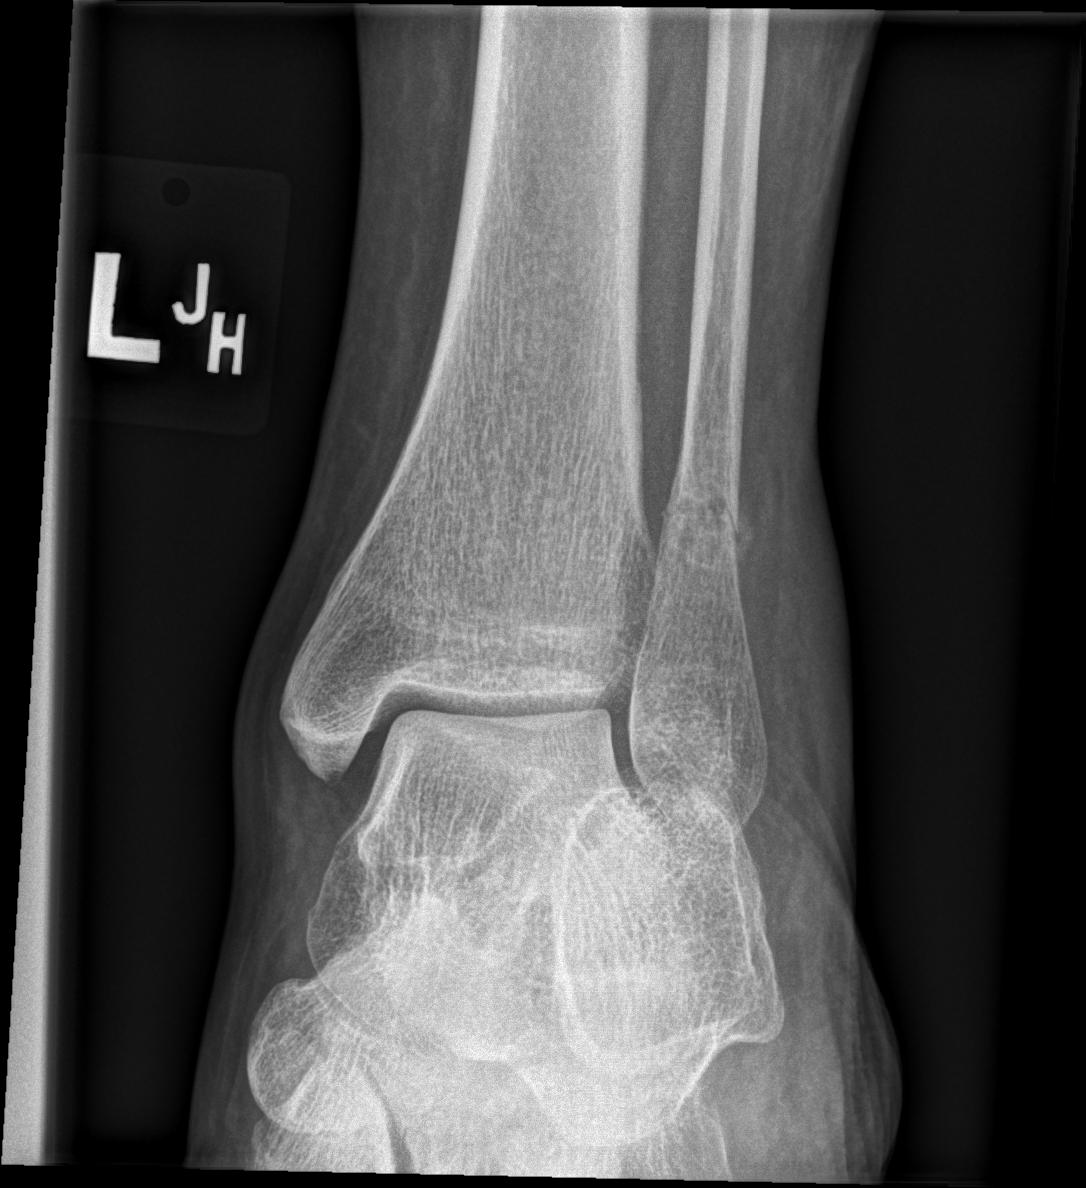

[x ankle lat left]
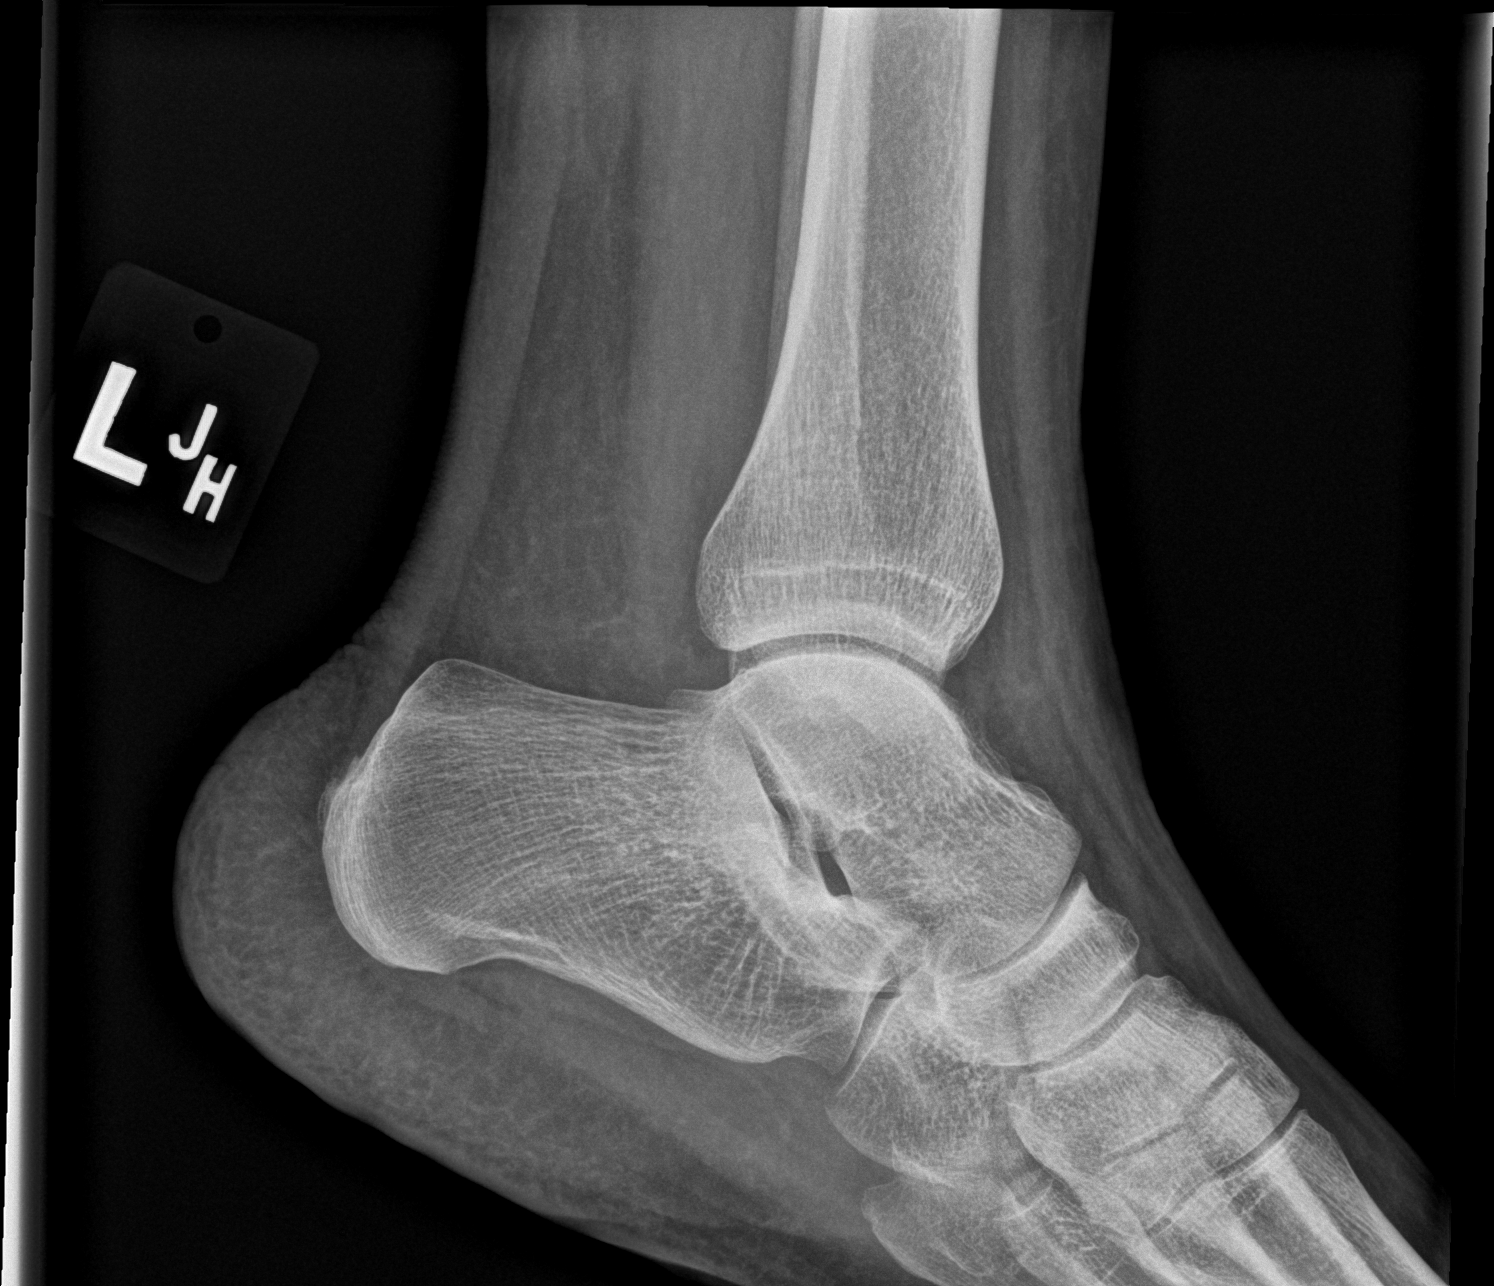

[3 of 3 positions shown; findings below may reference images not displayed]

FINDINGS: There is moderate soft tissue swelling overlying the lateral
malleolus. There is a subacute, nondisplaced transverse fracture of
the distal left fibula with mild adjacent periosteal new bone
formation. The ankle mortise remains approximated.
IMPRESSION: Healing, subacute, nondisplaced, transverse fracture of the distal
left fibula.

## 2017-09-19 ENCOUNTER — Encounter: Payer: Self-pay | Admitting: Adult Health

## 2017-09-19 ENCOUNTER — Telehealth: Payer: Self-pay | Admitting: Hematology and Oncology

## 2017-09-19 ENCOUNTER — Inpatient Hospital Stay: Payer: BLUE CROSS/BLUE SHIELD | Attending: Adult Health | Admitting: Adult Health

## 2017-09-19 VITALS — BP 136/66 | HR 64 | Temp 98.3°F | Resp 18 | Ht 67.0 in | Wt 199.9 lb

## 2017-09-19 DIAGNOSIS — Z170 Estrogen receptor positive status [ER+]: Secondary | ICD-10-CM | POA: Diagnosis not present

## 2017-09-19 DIAGNOSIS — Z923 Personal history of irradiation: Secondary | ICD-10-CM | POA: Insufficient documentation

## 2017-09-19 DIAGNOSIS — Z79810 Long term (current) use of selective estrogen receptor modulators (SERMs): Secondary | ICD-10-CM | POA: Diagnosis not present

## 2017-09-19 DIAGNOSIS — N951 Menopausal and female climacteric states: Secondary | ICD-10-CM | POA: Insufficient documentation

## 2017-09-19 DIAGNOSIS — D0512 Intraductal carcinoma in situ of left breast: Secondary | ICD-10-CM | POA: Diagnosis present

## 2017-09-19 DIAGNOSIS — E2839 Other primary ovarian failure: Secondary | ICD-10-CM

## 2017-09-19 NOTE — Telephone Encounter (Signed)
Gave avs and calendar ° °

## 2017-09-19 NOTE — Progress Notes (Signed)
 CLINIC:  Survivorship   REASON FOR VISIT:  Routine follow-up post-treatment for a recent history of breast cancer.  BRIEF ONCOLOGIC HISTORY:    Ductal carcinoma in situ (DCIS) of left breast   01/17/2017 Initial Diagnosis    Screening detected left breast calcifications 6 mm span ultrasound axilla negative, biopsy high-grade DCIS with calcifications and necrosis plus ALH, ER 95%, PR 70%, Tis N0 stage 0      03/04/2017 Surgery    Left lumpectomy: DCIS with calcifications, high-grade, 1.1 cm,ALH, margins negative, ER 95%, PR 70%, Tis Nx stage 0      04/04/2017 - 05/18/2017 Radiation Therapy    Adjuvant radiation therapy      07/2017 -  Anti-estrogen oral therapy    Tamoxifen daily       INTERVAL HISTORY:  Rachel Zimmerman presents to the Survivorship Clinic today for our initial meeting to review her survivorship care plan detailing her treatment course for breast cancer, as well as monitoring long-term side effects of that treatment, education regarding health maintenance, screening, and overall wellness and health promotion.     Overall, Rachel Zimmerman reports feeling quite well.  She is dealing with hot flashes and it seems like they are getting worse.  She is looking for resources on how to deal with this.  She does have a chillow, that she uses at night that helps.  She dresses in layers which helps as well.  She does not like to take medications, so she isn't really wanting to take anything to help with the hot flashes.      REVIEW OF SYSTEMS:  Review of Systems  Constitutional: Negative for appetite change, chills, fatigue, fever and unexpected weight change.  HENT:   Negative for hearing loss, lump/mass and trouble swallowing.   Eyes: Negative for eye problems and icterus.  Respiratory: Negative for chest tightness, cough and shortness of breath.   Cardiovascular: Negative for chest pain, leg swelling and palpitations.  Gastrointestinal: Negative for abdominal distention,  abdominal pain, constipation, diarrhea, nausea and vomiting.  Endocrine: Negative for hot flashes.  Musculoskeletal: Negative for arthralgias.  Skin: Negative for itching and rash.  Neurological: Negative for dizziness, extremity weakness, headaches and numbness.  Hematological: Negative for adenopathy. Does not bruise/bleed easily.  Psychiatric/Behavioral: Negative for depression. The patient is not nervous/anxious.   Breast: Denies any new nodularity, masses, tenderness, nipple changes, or nipple discharge.      ONCOLOGY TREATMENT TEAM:  1. Surgeon:  Dr. Ingram at [facility] 2. Medical Oncologist: Dr. Gudena  3. Radiation Oncologist: Dr. Moody    PAST MEDICAL/SURGICAL HISTORY:  Past Medical History:  Diagnosis Date  . Menopause   . Vitamin D deficiency    Past Surgical History:  Procedure Laterality Date  . APPENDECTOMY    . BREAST LUMPECTOMY WITH RADIOACTIVE SEED LOCALIZATION Left 03/04/2017   Procedure: BREAST LUMPECTOMY WITH RADIOACTIVE SEED LOCALIZATION;  Surgeon: Ingram, Haywood, MD;  Location: [facility];  Service: General;  Laterality: Left;  . CESAREAN SECTION     x 2  . COLONOSCOPY    . DILATATION & CURETTAGE/HYSTEROSCOPY WITH MYOSURE N/A 09/15/2016   Procedure: DILATATION & CURETTAGE/HYSTEROSCOPIC MYOMECTOMY  WITH MYOSURE;  Surgeon: Evelyn Varnado, MD;  Location: [facility];  Service: Gynecology;  Laterality: N/A;  Ultrasound Guidance Myosure - Fibroids  . OPERATIVE ULTRASOUND N/A 09/15/2016   Procedure: OPERATIVE ULTRASOUND;  Surgeon: Evelyn Varnado, MD;  Location: [facility];  Service: Gynecology;  Laterality: N/A;     ALLERGIES:    Allergies  Allergen Reactions  . Benadryl [Diphenhydramine Hcl (Sleep)]     Loopy, altered mental state     CURRENT MEDICATIONS:  Outpatient Encounter Medications as of 09/19/2017  Medication Sig Note  . tamoxifen (NOLVADEX) 20 MG tablet Take 1 tablet (20 mg total) daily by mouth.   . [DISCONTINUED]  hyaluronate sodium (RADIAPLEXRX) GEL Apply 1 application topically 2 (two) times daily. Apply to breast after rad x and bedtime 05/16/2017: 2nd tub given 05/16/17   No facility-administered encounter medications on file as of 09/19/2017.      ONCOLOGIC FAMILY HISTORY:  Family History  Problem Relation Age of Onset  . Hypertension Father   . Kidney failure Father   . Diabetes Father   . Stroke Father        Deceased  . Hypercholesterolemia Mother   . Myasthenia gravis Mother        Living  . Breast cancer Maternal Aunt   . Healthy Brother   . Healthy Son        x 2     GENETIC COUNSELING/TESTING: Not at this time  SOCIAL HISTORY:  Rachel Zimmerman is married and lives with her husband and two children in [place], [place].  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  She denies any current or history of tobacco, alcohol, or illicit drug use.     PHYSICAL EXAMINATION:  Vital Signs:   Vitals:   09/19/17 0915  BP: 136/66  Pulse: 64  Resp: 18  Temp: 98.3 F (36.8 C)  SpO2: 100%   Filed Weights   09/19/17 0915  Weight: 199 lb 14.4 oz (90.7 kg)   General: Well-nourished, well-appearing female in no acute distress.  She is unaccompanied today.   HEENT: Head is normocephalic.  Pupils equal and reactive to light. Conjunctivae clear without exudate.  Sclerae anicteric. Oral mucosa is pink, moist.  Oropharynx is pink without lesions or erythema.  Lymph: No cervical, supraclavicular, or infraclavicular lymphadenopathy noted on palpation.  Cardiovascular: Regular rate and rhythm.Marland Kitchen Respiratory: Clear to auscultation bilaterally. Chest expansion symmetric; breathing non-labored.  Breasts: right breast without nodules, masses, skin or nipple changes, left breast s/p lumpectomy, mild amt of scar tissue present at lumpectomy site, no nodularity.  No masses, or any other abnormality in left breast noted. GI: Abdomen soft and round;  non-tender, non-distended. Bowel sounds normoactive.  GU: Deferred.  Neuro: No focal deficits. Steady gait.  Psych: Mood and affect normal and appropriate for situation.  Extremities: No edema. MSK: No focal spinal tenderness to palpation.  Full range of motion in bilateral upper extremities Skin: Warm and dry.  LABORATORY DATA:  None for this visit.  DIAGNOSTIC IMAGING:  None for this visit.      ASSESSMENT AND PLAN:  Rachel Zimmerman is a pleasant 57 y.o. female with Stage 0 left breast DCIS, ER+/PR+/HER2-, diagnosed in 01/2017, treated with lumpectomy, adjuvant radiation therapy, and anti-estrogen therapy with Tamoxifen beginning in 07/2017.  She presents to the Survivorship Clinic for our initial meeting and routine follow-up post-completion of treatment for breast cancer.    1. Stage 0 left breast cancer:  Rachel Zimmerman is continuing to recover from definitive treatment for breast cancer. She will follow-up with her medical oncologist, Dr. Lindi Adie in 04/2018 with history and physical exam per surveillance protocol.  She will continue her anti-estrogen therapy with Tamoxifen. Thus far, she is tolerating the Tamoxifen well, with minimal side effects (see #2). She was  instructed to make Dr. Gudena or myself aware if she begins to experience any worsening side effects of the medication and I could see her back in clinic to help manage those side effects, as needed. Though the incidence is low, there is an associated risk of endometrial cancer with anti-estrogen therapies like Tamoxifen.  Rachel Zimmerman was encouraged to contact Dr. Gudena/Magrinat or myself with any vaginal bleeding while taking Tamoxifen. Other side effects of Tamoxifen were again reviewed with her as well. Today, a comprehensive survivorship care plan and treatment summary was reviewed with the patient today detailing her breast cancer diagnosis, treatment course, potential late/long-term effects of treatment, appropriate follow-up care  with recommendations for the future, and patient education resources.  A copy of this summary, along with a letter will be sent to the patient's primary care provider via mail/fax/In Basket message after today's visit.    2. Hot flashes: We reviewed non pharmacologic interventions for the hot flashes that she is experiencing, including avoiding hot and spicy foods, avoiding hot beverages.  We also reviewed acupuncture.  We reviewed starting medications, however we will wait and see how she does with non pharmacologic interventions first.   3. Bone health:  Given Rachel Zimmerman's age/history of breast cancer and h/o bone fracture, she is at risk for bone demineralization.  She has not undergone DEXA, and I am ordering one today to be done when she undergoes her mammogram.  In the meantime, she was encouraged to increase her consumption of foods rich in calcium, as well as increase her weight-bearing activities.  She was given education on specific activities to promote bone health.  4. Cancer screening:  Due to Rachel Zimmerman's history and her age, she should receive screening for skin cancers, colon cancer, and gynecologic cancers.  The information and recommendations are listed on the patient's comprehensive care plan/treatment summary and were reviewed in detail with the patient.    5. Health maintenance and wellness promotion: Rachel Zimmerman was encouraged to consume 5-7 servings of fruits and vegetables per day. We reviewed the "Nutrition Rainbow" handout, as well as the handout "Take Control of Your Health and Reduce Your Cancer Risk" from the American [employer] Society.  She was also encouraged to engage in moderate to vigorous exercise for 30 minutes per day most days of the week. We discussed the [employer] fitness program, which is designed for cancer survivors to help them become more physically fit after cancer treatments.  She was instructed to limit her alcohol consumption and continue to abstain from  tobacco use.     6. Support services/counseling: It is not uncommon for this period of the patient's cancer care trajectory to be one of many emotions and stressors.  We discussed an opportunity for her to participate in the next session of FYNN ("Finding Your New Normal") support group series designed for patients after they have completed treatment.   Rachel Zimmerman was encouraged to take advantage of our many other support services programs, support groups, and/or counseling in coping with her new life as a cancer survivor after completing anti-cancer treatment.  She was offered support today through active listening and expressive supportive counseling.  She was given information regarding our available services and encouraged to contact me with any questions or for help enrolling in any of our support group/programs.    Dispo:   -Return to cancer center for f/u with Dr. Gudena in 7 months  -Mammogram due in 01/2018 -Bone density to be done at   time of mammogram 01/2018 -Follow up with Dr. Dalbert Batman 10/07/2017 as scheduled -She is welcome to return back to the Survivorship Clinic at any time; no additional follow-up needed at this time.  -Consider referral back to survivorship as a long-term survivor for continued surveillance  A total of (30) minutes of face-to-face time was spent with this patient with greater than 50% of that time in counseling and care-coordination.   Gardenia Phlegm, NP Survivorship Program [facility] [phone]   Note: PRIMARY CARE PROVIDER Donald Prose, [place] [phone]

## 2017-09-28 ENCOUNTER — Other Ambulatory Visit: Payer: Self-pay

## 2017-09-28 MED ORDER — TAMOXIFEN CITRATE 20 MG PO TABS: 20.0000 mg | ORAL_TABLET | Freq: Every day | ORAL | 3 refills | Status: DC

## 2018-01-10 ENCOUNTER — Encounter: Payer: Self-pay | Admitting: Hematology and Oncology

## 2018-04-19 ENCOUNTER — Telehealth: Payer: Self-pay | Admitting: Hematology and Oncology

## 2018-04-19 NOTE — Telephone Encounter (Signed)
Patient called to reschedule  °

## 2018-04-21 ENCOUNTER — Inpatient Hospital Stay: Payer: BLUE CROSS/BLUE SHIELD | Admitting: Hematology and Oncology

## 2018-04-24 ENCOUNTER — Inpatient Hospital Stay: Payer: BLUE CROSS/BLUE SHIELD | Attending: Hematology and Oncology | Admitting: Hematology and Oncology

## 2018-04-24 DIAGNOSIS — Z923 Personal history of irradiation: Secondary | ICD-10-CM | POA: Diagnosis not present

## 2018-04-24 DIAGNOSIS — Z79810 Long term (current) use of selective estrogen receptor modulators (SERMs): Secondary | ICD-10-CM | POA: Diagnosis not present

## 2018-04-24 DIAGNOSIS — D0512 Intraductal carcinoma in situ of left breast: Secondary | ICD-10-CM | POA: Diagnosis present

## 2018-04-24 MED ORDER — TAMOXIFEN CITRATE 20 MG PO TABS: 20.0000 mg | ORAL_TABLET | Freq: Every day | ORAL | 3 refills | Status: DC

## 2018-04-24 NOTE — Progress Notes (Signed)
Patient Care Team: Donald Prose, MD as PCP - General (Family Medicine) Fanny Skates, MD as Consulting Physician (General Surgery) Nicholas Lose, MD as Consulting Physician (Hematology and Oncology) Kyung Rudd, MD as Consulting Physician (Radiation Oncology) Delice Bison Charlestine Massed, NP as Nurse Practitioner (Hematology and Oncology)  DIAGNOSIS:  Encounter Diagnosis  Name Primary?  . Ductal carcinoma in situ (DCIS) of left breast     SUMMARY OF ONCOLOGIC HISTORY:   Ductal carcinoma in situ (DCIS) of left breast   01/17/2017 Initial Diagnosis    Screening detected left breast calcifications 6 mm span ultrasound axilla negative, biopsy high-grade DCIS with calcifications and necrosis plus ALH, ER 95%, PR 70%, Tis N0 stage 0    03/04/2017 Surgery    Left lumpectomy: DCIS with calcifications, high-grade, 1.1 cm,ALH, margins negative, ER 95%, PR 70%, Tis Nx stage 0    04/04/2017 - 05/18/2017 Radiation Therapy    Adjuvant radiation therapy    07/2017 -  Anti-estrogen oral therapy    Tamoxifen daily     CHIEF COMPLIANT: Follow-up on tamoxifen therapy  INTERVAL HISTORY: Rachel Zimmerman is a 57 year old with above-mentioned history of left breast DCIS underwent lumpectomy radiation is currently on tamoxifen.  She is tolerating tamoxifen extremely well.  She denies any hot flashes or myalgias.  Denies any excessive bleeding per uterus.  Denies any lumps or nodules in the breast  REVIEW OF SYSTEMS:   Constitutional: Denies fevers, chills or abnormal weight loss Eyes: Denies blurriness of vision Ears, nose, mouth, throat, and face: Denies mucositis or sore throat Respiratory: Denies cough, dyspnea or wheezes Cardiovascular: Denies palpitation, chest discomfort Gastrointestinal:  Denies nausea, heartburn or change in bowel habits Skin: Denies abnormal skin rashes Lymphatics: Denies new lymphadenopathy or easy bruising Neurological:Denies numbness, tingling or new  weaknesses Behavioral/Psych: Mood is stable, no new changes  Extremities: No lower extremity edema Breast:  denies any pain or lumps or nodules in either breasts All other systems were reviewed with the patient and are negative.  I have reviewed the past medical history, past surgical history, social history and family history with the patient and they are unchanged from previous note.  ALLERGIES:  is allergic to benadryl [diphenhydramine hcl (sleep)].  MEDICATIONS:  Current Outpatient Medications  Medication Sig Dispense Refill  . tamoxifen (NOLVADEX) 20 MG tablet Take 1 tablet (20 mg total) by mouth daily. 90 tablet 3   No current facility-administered medications for this visit.     PHYSICAL EXAMINATION: ECOG PERFORMANCE STATUS: 0 - Asymptomatic  Vitals:   04/24/18 1153  BP: 127/62  Pulse: 62  Resp: 18  Temp: 98.3 F (36.8 C)  SpO2: 100%   Filed Weights   04/24/18 1153  Weight: 171 lb 3.2 oz (77.7 kg)    GENERAL:alert, no distress and comfortable SKIN: skin color, texture, turgor are normal, no rashes or significant lesions EYES: normal, Conjunctiva are pink and non-injected, sclera clear OROPHARYNX:no exudate, no erythema and lips, buccal mucosa, and tongue normal  NECK: supple, thyroid normal size, non-tender, without nodularity LYMPH:  no palpable lymphadenopathy in the cervical, axillary or inguinal LUNGS: clear to auscultation and percussion with normal breathing effort HEART: regular rate & rhythm and no murmurs and no lower extremity edema ABDOMEN:abdomen soft, non-tender and normal bowel sounds MUSCULOSKELETAL:no cyanosis of digits and no clubbing  NEURO: alert & oriented x 3 with fluent speech, no focal motor/sensory deficits EXTREMITIES: No lower extremity edema BREAST: No palpable masses or nodules in either right or left breasts. No  palpable axillary supraclavicular or infraclavicular adenopathy no breast tenderness or nipple discharge. (exam performed in  the presence of a chaperone)  LABORATORY DATA:  I have reviewed the data as listed CMP Latest Ref Rng & Units 02/02/2017  Glucose 70 - 140 mg/dl 86  BUN 7.0 - 26.0 mg/dL 22.4  Creatinine 0.6 - 1.1 mg/dL 0.9  Sodium 136 - 145 mEq/L 141  Potassium 3.5 - 5.1 mEq/L 3.9  CO2 22 - 29 mEq/L 27  Calcium 8.4 - 10.4 mg/dL 9.9  Total Protein 6.4 - 8.3 g/dL 8.0  Total Bilirubin 0.20 - 1.20 mg/dL 0.87  Alkaline Phos 40 - 150 U/L 67  AST 5 - 34 U/L 19  ALT 0 - 55 U/L 14    Lab Results  Component Value Date   WBC 6.4 02/02/2017   HGB 13.3 02/02/2017   HCT 40.9 02/02/2017   MCV 89.7 02/02/2017   PLT 195 02/02/2017   NEUTROABS 3.5 02/02/2017    ASSESSMENT & PLAN:  Ductal carcinoma in situ (DCIS) of left breast 03/04/2017:Left lumpectomy: DCIS with calcifications, high-grade, 1.1 cm,ALH, margins negative, ER 95%, PR 70%, Tis Nx stage 0  Adjuvant radiation 04/04/2017-05/18/2017 Recommendation: Adjuvant antiestrogen therapy with tamoxifen 20 mg daily for 5 years, started July 05, 2017  Tamoxifen toxicities: Patient is tolerating tamoxifen extremely well.  She has absolutely no side effects from the treatment.  Patient is concerned about heavy uterine bleeding as she has had problems with that in the past and required a D&C. She is also worried about anastrozole related bone loss as she has already had a fracture previously.  Return to clinic in 1 year for follow-up    No orders of the defined types were placed in this encounter.  The patient has a good understanding of the overall plan. she agrees with it. she will call with any problems that may develop before the next visit here.   Harriette Ohara, MD 04/24/18

## 2018-04-24 NOTE — Assessment & Plan Note (Signed)
03/04/2017:Left lumpectomy: DCIS with calcifications, high-grade, 1.1 cm,ALH, margins negative, ER 95%, PR 70%, Tis Nx stage 0  Adjuvant radiation 04/04/2017-05/18/2017 Recommendation: Adjuvant antiestrogen therapy with tamoxifen 20 mg daily for 5 years, which she will start July 05, 2017  Tamoxifen toxicities:   Patient is concerned about heavy uterine bleeding as she has had problems with that in the past and required a D&C. She is also worried about anastrozole related bone loss as she has already had a fracture previously.  Return to clinic in 6 months for follow-up and after that we can see her once a year

## 2018-04-25 ENCOUNTER — Telehealth: Payer: Self-pay | Admitting: Hematology and Oncology

## 2018-04-25 NOTE — Telephone Encounter (Signed)
Per 10/22 los.  Called patient and left message for one year appt.  Mailed calendar.

## 2019-04-18 ENCOUNTER — Other Ambulatory Visit: Payer: Self-pay | Admitting: Hematology and Oncology

## 2019-04-30 NOTE — Progress Notes (Signed)
Patient Care Team: Donald Prose, MD as PCP - General (Family Medicine) Fanny Skates, MD as Consulting Physician (General Surgery) Nicholas Lose, MD as Consulting Physician (Hematology and Oncology) Kyung Rudd, MD as Consulting Physician (Radiation Oncology) Delice Bison Charlestine Massed, NP as Nurse Practitioner (Hematology and Oncology)  DIAGNOSIS:    ICD-10-CM   1. Ductal carcinoma in situ (DCIS) of left breast  D05.12     SUMMARY OF ONCOLOGIC HISTORY: Oncology History  Ductal carcinoma in situ (DCIS) of left breast  01/17/2017 Initial Diagnosis   Screening detected left breast calcifications 6 mm span ultrasound axilla negative, biopsy high-grade DCIS with calcifications and necrosis plus ALH, ER 95%, PR 70%, Tis N0 stage 0   03/04/2017 Surgery   Left lumpectomy: DCIS with calcifications, high-grade, 1.1 cm,ALH, margins negative, ER 95%, PR 70%, Tis Nx stage 0   04/04/2017 - 05/18/2017 Radiation Therapy   Adjuvant radiation therapy   07/2017 -  Anti-estrogen oral therapy   Tamoxifen daily     CHIEF COMPLIANT: Follow-up on tamoxifen therapy  INTERVAL HISTORY: Rachel Zimmerman is a 58 y.o. with above-mentioned history of left breast DCIS who underwent a lumpectomy, radiation, and is currently on tamoxifen. I last saw her a year ago. Mammogram on 01/11/19 showed no evidence of malignancy bilaterally. She presents to the clinic today for follow-up.    REVIEW OF SYSTEMS:   Constitutional: Denies fevers, chills or abnormal weight loss Eyes: Denies blurriness of vision Ears, nose, mouth, throat, and face: Denies mucositis or sore throat Respiratory: Denies cough, dyspnea or wheezes Cardiovascular: Denies palpitation, chest discomfort Gastrointestinal: Denies nausea, heartburn or change in bowel habits Skin: Denies abnormal skin rashes Lymphatics: Denies new lymphadenopathy or easy bruising Neurological: Denies numbness, tingling or new weaknesses Behavioral/Psych: Mood is stable, no  new changes  Extremities: No lower extremity edema Breast: denies any pain or lumps or nodules in either breasts All other systems were reviewed with the patient and are negative.  I have reviewed the past medical history, past surgical history, social history and family history with the patient and they are unchanged from previous note.  ALLERGIES:  is allergic to benadryl [diphenhydramine hcl (sleep)].  MEDICATIONS:  Current Outpatient Medications  Medication Sig Dispense Refill  . tamoxifen (NOLVADEX) 20 MG tablet TAKE 1 TABLET DAILY 90 tablet 0   No current facility-administered medications for this visit.     PHYSICAL EXAMINATION: ECOG PERFORMANCE STATUS: 1 - Symptomatic but completely ambulatory  There were no vitals filed for this visit. There were no vitals filed for this visit.  GENERAL: alert, no distress and comfortable SKIN: skin color, texture, turgor are normal, no rashes or significant lesions EYES: normal, Conjunctiva are pink and non-injected, sclera clear OROPHARYNX: no exudate, no erythema and lips, buccal mucosa, and tongue normal  NECK: supple, thyroid normal size, non-tender, without nodularity LYMPH: no palpable lymphadenopathy in the cervical, axillary or inguinal LUNGS: clear to auscultation and percussion with normal breathing effort HEART: regular rate & rhythm and no murmurs and no lower extremity edema ABDOMEN: abdomen soft, non-tender and normal bowel sounds MUSCULOSKELETAL: no cyanosis of digits and no clubbing  NEURO: alert & oriented x 3 with fluent speech, no focal motor/sensory deficits EXTREMITIES: No lower extremity edema BREAST: No palpable masses or nodules in either right or left breasts. No palpable axillary supraclavicular or infraclavicular adenopathy no breast tenderness or nipple discharge. (exam performed in the presence of a chaperone)  LABORATORY DATA:  I have reviewed the data as listed CMP  Latest Ref Rng & Units 02/02/2017   Glucose 70 - 140 mg/dl 86  BUN 7.0 - 26.0 mg/dL 22.4  Creatinine 0.6 - 1.1 mg/dL 0.9  Sodium 136 - 145 mEq/L 141  Potassium 3.5 - 5.1 mEq/L 3.9  CO2 22 - 29 mEq/L 27  Calcium 8.4 - 10.4 mg/dL 9.9  Total Protein 6.4 - 8.3 g/dL 8.0  Total Bilirubin 0.20 - 1.20 mg/dL 0.87  Alkaline Phos 40 - 150 U/L 67  AST 5 - 34 U/L 19  ALT 0 - 55 U/L 14    Lab Results  Component Value Date   WBC 6.4 02/02/2017   HGB 13.3 02/02/2017   HCT 40.9 02/02/2017   MCV 89.7 02/02/2017   PLT 195 02/02/2017   NEUTROABS 3.5 02/02/2017    ASSESSMENT & PLAN:  Ductal carcinoma in situ (DCIS) of left breast 03/04/2017:Left lumpectomy: DCIS with calcifications, high-grade, 1.1 cm,ALH, margins negative, ER 95%, PR 70%, Tis Nx stage 0  Adjuvant radiation 04/04/2017-05/18/2017 Recommendation: Adjuvant antiestrogen therapy with tamoxifen 20 mg daily for 5 years, started July 05, 2017  Tamoxifen toxicities: Patient is tolerating tamoxifen extremely well.  She has absolutely no side effects from the treatment.  Patient was concerned about heavy uterine bleeding as she has had problems with that in the past and required a D&C. She is also worried about anastrozole related bone loss as she has already had a fracture previously.  Return to clinic in 1 year for follow-up  No orders of the defined types were placed in this encounter.  The patient has a good understanding of the overall plan. she agrees with it. she will call with any problems that may develop before the next visit here.  Nicholas Lose, MD 05/01/2019  Julious Oka Dorshimer am acting as scribe for Dr. Nicholas Lose.  I have reviewed the above documentation for accuracy and completeness, and I agree with the above.

## 2019-05-01 ENCOUNTER — Other Ambulatory Visit: Payer: Self-pay

## 2019-05-01 ENCOUNTER — Inpatient Hospital Stay: Payer: BC Managed Care – PPO | Attending: Hematology and Oncology | Admitting: Hematology and Oncology

## 2019-05-01 DIAGNOSIS — Z79810 Long term (current) use of selective estrogen receptor modulators (SERMs): Secondary | ICD-10-CM | POA: Insufficient documentation

## 2019-05-01 DIAGNOSIS — Z923 Personal history of irradiation: Secondary | ICD-10-CM | POA: Insufficient documentation

## 2019-05-01 DIAGNOSIS — D0512 Intraductal carcinoma in situ of left breast: Secondary | ICD-10-CM | POA: Insufficient documentation

## 2019-05-01 MED ORDER — TAMOXIFEN CITRATE 20 MG PO TABS: 20.0000 mg | ORAL_TABLET | Freq: Every day | ORAL | 3 refills | Status: DC

## 2019-05-01 NOTE — Assessment & Plan Note (Signed)
03/04/2017:Left lumpectomy: DCIS with calcifications, high-grade, 1.1 cm,ALH, margins negative, ER 95%, PR 70%, Tis Nx stage 0  Adjuvant radiation 04/04/2017-05/18/2017 Recommendation: Adjuvant antiestrogen therapy with tamoxifen 20 mg daily for 5 years, started July 05, 2017  Tamoxifen toxicities: Patient is tolerating tamoxifen extremely well.  She has absolutely no side effects from the treatment.  Patient is concerned about heavy uterine bleeding as she has had problems with that in the past and required a D&C. She is also worried about anastrozole related bone loss as she has already had a fracture previously.  Return to clinic in 1 year for follow-up

## 2019-05-02 ENCOUNTER — Telehealth: Payer: Self-pay | Admitting: Hematology and Oncology

## 2019-05-02 NOTE — Telephone Encounter (Signed)
I left a message regarding schedule  

## 2020-01-22 ENCOUNTER — Telehealth: Payer: Self-pay | Admitting: Hematology and Oncology

## 2020-01-22 NOTE — Telephone Encounter (Signed)
Rescheduled patient per 7/1 message. Patient is aware of updated appointment time and date.

## 2020-04-24 ENCOUNTER — Other Ambulatory Visit: Payer: Self-pay | Admitting: Hematology and Oncology

## 2020-04-29 NOTE — Progress Notes (Signed)
Patient Care Team: Donald Prose, MD as PCP - General (Family Medicine) Fanny Skates, MD as Consulting Physician (General Surgery) Nicholas Lose, MD as Consulting Physician (Hematology and Oncology) Kyung Rudd, MD as Consulting Physician (Radiation Oncology) Delice Bison Charlestine Massed, NP as Nurse Practitioner (Hematology and Oncology)  DIAGNOSIS:    ICD-10-CM   1. Ductal carcinoma in situ (DCIS) of left breast  D05.12     SUMMARY OF ONCOLOGIC HISTORY: Oncology History  Ductal carcinoma in situ (DCIS) of left breast  01/17/2017 Initial Diagnosis   Screening detected left breast calcifications 6 mm span ultrasound axilla negative, biopsy high-grade DCIS with calcifications and necrosis plus ALH, ER 95%, PR 70%, Tis N0 stage 0   03/04/2017 Surgery   Left lumpectomy: DCIS with calcifications, high-grade, 1.1 cm,ALH, margins negative, ER 95%, PR 70%, Tis Nx stage 0   04/04/2017 - 05/18/2017 Radiation Therapy   Adjuvant radiation therapy   07/2017 -  Anti-estrogen oral therapy   Tamoxifen daily     CHIEF COMPLIANT: Follow-up of left breast DCIS on tamoxifen therapy  INTERVAL HISTORY: Rachel Zimmerman is a 59 y.o. with above-mentioned history of left breast DCIS who underwent a lumpectomy, radiation, and is currently on tamoxifen. She presents to the clinic today for follow-up.    She is tolerating tamoxifen extremely well without any problems or concerns.  Denies any hot flashes or myalgias.  Denies any lumps or nodules in the breast.  She has been on for the past 3 years.  ALLERGIES:  is allergic to benadryl [diphenhydramine hcl (sleep)].  MEDICATIONS:  Current Outpatient Medications  Medication Sig Dispense Refill  . tamoxifen (NOLVADEX) 20 MG tablet Take 1 tablet (20 mg total) by mouth daily. 90 tablet 3   No current facility-administered medications for this visit.    PHYSICAL EXAMINATION: ECOG PERFORMANCE STATUS: 0 - Asymptomatic  Vitals:   04/30/20 1536  BP: 123/79    Pulse: 64  Resp: 17  Temp: 98.3 F (36.8 C)  SpO2: 100%   Filed Weights   04/30/20 1536  Weight: 172 lb (78 kg)    BREAST: No palpable masses or nodules in either right or left breasts. No palpable axillary supraclavicular or infraclavicular adenopathy no breast tenderness or nipple discharge. (exam performed in the presence of a chaperone)  LABORATORY DATA:  I have reviewed the data as listed CMP Latest Ref Rng & Units 02/02/2017  Glucose 70 - 140 mg/dl 86  BUN 7.0 - 26.0 mg/dL 22.4  Creatinine 0.6 - 1.1 mg/dL 0.9  Sodium 136 - 145 mEq/L 141  Potassium 3.5 - 5.1 mEq/L 3.9  CO2 22 - 29 mEq/L 27  Calcium 8.4 - 10.4 mg/dL 9.9  Total Protein 6.4 - 8.3 g/dL 8.0  Total Bilirubin 0.20 - 1.20 mg/dL 0.87  Alkaline Phos 40 - 150 U/L 67  AST 5 - 34 U/L 19  ALT 0 - 55 U/L 14    Lab Results  Component Value Date   WBC 6.4 02/02/2017   HGB 13.3 02/02/2017   HCT 40.9 02/02/2017   MCV 89.7 02/02/2017   PLT 195 02/02/2017   NEUTROABS 3.5 02/02/2017    ASSESSMENT & PLAN:  Ductal carcinoma in situ (DCIS) of left breast 03/04/2017:Left lumpectomy: DCIS with calcifications, high-grade, 1.1 cm,ALH, margins negative, ER 95%, PR 70%, Tis Nx stage 0  Adjuvant radiation 04/04/2017-05/18/2017 Recommendation: Adjuvant antiestrogen therapy with tamoxifen 20 mg daily for 5 years,startedJanuary 1, 2019  Tamoxifentoxicities: Denies any adverse effects with tamoxifen.  Denies any hot  flashes or arthralgias or myalgias.    Patient was concerned about heavy uterine bleeding as she has had problems with that in the past and required a D&C.  She has no problems with uterine bleeding anymore.    Return to clinic in1 year for follow-up    No orders of the defined types were placed in this encounter.  The patient has a good understanding of the overall plan. she agrees with it. she will call with any problems that may develop before the next visit here.  Total time spent: 20 mins  including face to face time and time spent for planning, charting and coordination of care  Nicholas Lose, MD 04/30/2020  I, Cloyde Reams Dorshimer, am acting as scribe for Dr. Nicholas Lose.  I have reviewed the above documentation for accuracy and completeness, and I agree with the above.

## 2020-04-30 ENCOUNTER — Other Ambulatory Visit: Payer: Self-pay

## 2020-04-30 ENCOUNTER — Inpatient Hospital Stay: Payer: BC Managed Care – PPO | Attending: Hematology and Oncology | Admitting: Hematology and Oncology

## 2020-04-30 DIAGNOSIS — Z79810 Long term (current) use of selective estrogen receptor modulators (SERMs): Secondary | ICD-10-CM | POA: Insufficient documentation

## 2020-04-30 DIAGNOSIS — D0512 Intraductal carcinoma in situ of left breast: Secondary | ICD-10-CM | POA: Diagnosis not present

## 2020-04-30 DIAGNOSIS — Z923 Personal history of irradiation: Secondary | ICD-10-CM | POA: Insufficient documentation

## 2020-04-30 MED ORDER — TAMOXIFEN CITRATE 20 MG PO TABS: 20.0000 mg | ORAL_TABLET | Freq: Every day | ORAL | 3 refills | Status: DC

## 2020-04-30 NOTE — Assessment & Plan Note (Signed)
03/04/2017:Left lumpectomy: DCIS with calcifications, high-grade, 1.1 cm,ALH, margins negative, ER 95%, PR 70%, Tis Nx stage 0  Adjuvant radiation 04/04/2017-05/18/2017 Recommendation: Adjuvant antiestrogen therapy with tamoxifen 20 mg daily for 5 years,startedJanuary 1, 2019  Tamoxifentoxicities: Denies any adverse effects with tamoxifen.  Denies any hot flashes or arthralgias or myalgias.    Patient was concerned about heavy uterine bleeding as she has had problems with that in the past and required a D&C.  She has no problems with uterine bleeding anymore.    Return to clinic in1 year for follow-up

## 2020-05-01 ENCOUNTER — Ambulatory Visit: Payer: BC Managed Care – PPO | Admitting: Hematology and Oncology

## 2021-03-25 ENCOUNTER — Encounter (INDEPENDENT_AMBULATORY_CARE_PROVIDER_SITE_OTHER): Payer: Self-pay | Admitting: Ophthalmology

## 2021-03-25 ENCOUNTER — Ambulatory Visit (INDEPENDENT_AMBULATORY_CARE_PROVIDER_SITE_OTHER): Payer: BC Managed Care – PPO | Admitting: Ophthalmology

## 2021-03-25 ENCOUNTER — Other Ambulatory Visit: Payer: Self-pay

## 2021-03-25 DIAGNOSIS — H3581 Retinal edema: Secondary | ICD-10-CM | POA: Diagnosis not present

## 2021-03-25 DIAGNOSIS — H35413 Lattice degeneration of retina, bilateral: Secondary | ICD-10-CM

## 2021-03-25 DIAGNOSIS — H3589 Other specified retinal disorders: Secondary | ICD-10-CM

## 2021-03-25 MED ORDER — PREDNISOLONE ACETATE 1 % OP SUSP: 1.0000 [drp] | Freq: Four times a day (QID) | OPHTHALMIC | 0 refills | Status: AC

## 2021-03-25 NOTE — Progress Notes (Signed)
[facility] Note  03/25/2021     CHIEF COMPLAINT Patient presents for Retina Evaluation   HISTORY OF PRESENT ILLNESS: Rachel Zimmerman is a 60 y.o. female who presents to the clinic today for:   HPI     Retina Evaluation   In both eyes.  This started 2 days ago.  Duration of 2 days.  Associated Symptoms Flashes and Floaters.  Context:  distance vision and near vision.  I, the attending physician,  performed the HPI with the patient and updated documentation appropriately.        Comments   Pt states she began having floaters OS back in July--thinks she may have had a FOL before floaters began.  Pt states she ignored it and thought it would go away.  Pt saw Dr. Nicki Reaper 2 days ago and states Dr. Nicki Reaper found problem in OD instead of OS.   Pt complains of mild blurring of vision in the distance.  Pt wears glasses primarily for computer and reading.  Pt is a Probation officer for [employer]      Last edited by Bernarda Caffey, MD on 03/25/2021 12:01 PM.    Pt has been noticing blurred vision OU and some floaters OS since around late July 2022.  Referring physician: Macarthur Critchley, [address]. [place],  [place] [address]  HISTORICAL INFORMATION:   Selected notes from the MEDICAL RECORD NUMBER Referred by Dr. Nicki Reaper for eval of poss HST OD   CURRENT MEDICATIONS: Current Outpatient Medications (Ophthalmic Drugs)  Medication Sig   prednisoLONE acetate (PRED FORTE) 1 % ophthalmic suspension Place 1 drop into the right eye 4 (four) times daily for 7 days.   No current facility-administered medications for this visit. (Ophthalmic Drugs)   Current Outpatient Medications (Other)  Medication Sig   tamoxifen (NOLVADEX) 20 MG tablet Take 1 tablet (20 mg total) by mouth daily.   No current facility-administered medications for this visit. (Other)   REVIEW OF SYSTEMS: ROS   Positive for: Eyes Negative for: Constitutional, Gastrointestinal, Neurological, Skin,  Genitourinary, Musculoskeletal, HENT, Endocrine, Cardiovascular, Respiratory, Psychiatric, Allergic/Imm, Heme/Lymph Last edited by Doneen Poisson on 03/25/2021  9:51 AM.     ALLERGIES Allergies  Allergen Reactions   Benadryl [Diphenhydramine Hcl (Sleep)]     Loopy, altered mental state   PAST MEDICAL HISTORY Past Medical History:  Diagnosis Date   Menopause    Vitamin D deficiency    Past Surgical History:  Procedure Laterality Date   APPENDECTOMY     BREAST LUMPECTOMY WITH RADIOACTIVE SEED LOCALIZATION Left 03/04/2017   Procedure: BREAST LUMPECTOMY WITH RADIOACTIVE SEED LOCALIZATION;  Surgeon: Fanny Skates, MD;  Location: [facility];  Service: General;  Laterality: Left;   CESAREAN SECTION     x 2   COLONOSCOPY     DILATATION & CURETTAGE/HYSTEROSCOPY WITH MYOSURE N/A 09/15/2016   Procedure: [place];  Surgeon: Thurnell Lose, MD;  Location: [facility];  Service: Gynecology;  Laterality: N/A;  Ultrasound Guidance Myosure - Fibroids   OPERATIVE ULTRASOUND N/A 09/15/2016   Procedure: OPERATIVE ULTRASOUND;  Surgeon: Thurnell Lose, MD;  Location: [facility];  Service: Gynecology;  Laterality: N/A;   FAMILY HISTORY Family History  Problem Relation Age of Onset   Hypertension Father    Kidney failure Father    Diabetes Father    Stroke Father        Deceased   Hypercholesterolemia Mother    Myasthenia gravis  Mother        Living   Breast cancer Maternal Aunt    Healthy Brother    Healthy Son        x 2   SOCIAL HISTORY Social History   Tobacco Use   Smoking status: Never   Smokeless tobacco: Never  Substance Use Topics   Alcohol use: Yes    Alcohol/week: 0.0 standard drinks    Comment: Socially a few times per year   Drug use: No         OPHTHALMIC EXAM:  Base Eye Exam     Visual Acuity (Snellen - Linear)       Right Left   Dist cc 20/20 -2 20/20 -1    Correction: Glasses          Tonometry (Tonopen, 10:19 AM)       Right Left   Pressure 12 13         Pupils       Dark Light Shape React APD   Right 3 2 Round Brisk 0   Left 3 2 Round Brisk 0         Visual Fields       Left Right    Full Full         Extraocular Movement       Right Left    Full Full         Neuro/Psych     Oriented x3: Yes   Mood/Affect: Normal         Dilation     Both eyes: 1.0% Mydriacyl, 2.5% Phenylephrine @ 10:19 AM           Slit Lamp and Fundus Exam     Slit Lamp Exam       Right Left   Lids/Lashes Normal Normal   Conjunctiva/Sclera Mild melanosis Mild melanosis   Cornea Mild arcus Mild arcus   Anterior Chamber Deep and quiet Deep and quiet   Iris Round and dilated Round and dilated   Lens Clear Clear   Vitreous Syneresis Syneresis, PVD         Fundus Exam       Right Left   Disc Pink, sharp, mild PPP temporally Pink, sharp   C/D Ratio 0.5 0.5   Macula Flat, good foveal reflex, no heme or edema Flat, good foveal reflex, no heme or edema   Vessels Mild attenuation Mild attenuation   Periphery Attached; small patch of lattice w/retinal break at 0730--almost to ORA, no SRF;  Focal WWP at 0830--simulates HST but no tears on scleral depression Focal patch of lattice at 0600--almost to ORA, otherwise attached, +WWP, no RT/RD on scleral depression           Refraction     Wearing Rx       Sphere Cylinder Axis Add   Right +0.00 +0.50 132 +2.50   Left +0.00 +0.75 145 +2.50    Age: 10 yr   Type: PROG           IMAGING AND PROCEDURES  Imaging and Procedures for 03/25/2021  OCT, Retina - OU - Both Eyes       Right Eye Quality was good. Central Foveal Thickness: 245. Progression has no prior data. Findings include normal foveal contour, no IRF, no SRF.   Left Eye Quality was good. Central Foveal Thickness: 248. Progression has no prior data. Findings include normal foveal contour, no IRF, no SRF.   Notes *Images captured and  stored on drive  Diagnosis / Impression:  OU: NFP, no IRF/SRF  Clinical management:  See below  Abbreviations: NFP - Normal foveal profile. CME - cystoid macular edema. PED - pigment epithelial detachment. IRF - intraretinal fluid. SRF - subretinal fluid. EZ - ellipsoid zone. ERM - epiretinal membrane. ORA - outer retinal atrophy. ORT - outer retinal tubulation. SRHM - subretinal hyper-reflective material. IRHM - intraretinal hyper-reflective material      Repair Retinal Breaks, Laser - OD - Right Eye       Time Out Confirmed correct patient, procedure, site, and patient consented.   Anesthesia Topical anesthesia was used. Anesthetic medications included Proparacaine 0.5%.   Notes LASER PROCEDURE NOTE  Procedure:  Barrier laser retinopexy using slit lamp laser, RIGHT eye   Diagnosis:   Retinal break, RIGHT eye                     730 o'clock anterior to equator -- almost to ora  Surgeon: Bernarda Caffey, MD, PhD  Anesthesia: Topical  Informed consent obtained, operative eye marked, and time out performed prior to initiation of laser.   Laser settings:  Lumenis [id number] laser, slit lamp Lens: Mainster PRP 165 Power: 300 mW Spot size: 200 microns Duration: 30 msec  # spots: 125  Placement of laser: Using a Mainster PRP 165 contact lens at the slit lamp, laser was placed in three confluent rows around retinal break at 0730 oclock anterior to equator. Laser indirect ophthalmoscopy w/ scleral depression was used to complete the anterior portion of the laser: 61 spots; 280 mW power; 70 ms duration.  Complications: None.  Patient tolerated the procedure well and received written and verbal post-procedure care information/education.            ASSESSMENT/PLAN:    ICD-10-CM   1. 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 Retinal edema  H35.81 OCT, Retina - OU -  Both Eyes    1.Retinal break, OD.   - The incidence, risk factors, and natural history of retinal tear was discussed with patient.   - Potential treatment options including laser retinopexy and cryotherapy discussed with patient. - focal patch of lattice w/ retinal break at 730, almost to ora - recommend laser retinopexy OD today, 9.21.22 - RBA of procedure discussed, questions answered - informed consent obtained and signed - see procedure note - start Lotemax QID OD x7 days - f/u in 2 wks  2,3. Lattice degeneration both eyes - small patches of lattice OU -- OD at 0730 w/ +retinal break (see above); OS at 0600 - discussed findings, prognosis, and treatment options including observation - recommend laser retinopexy OD as above - f/u in 2 wks   Ophthalmic Meds Ordered this visit:  Meds ordered this encounter  Medications   prednisoLONE acetate (PRED FORTE) 1 % ophthalmic suspension    Sig: Place 1 drop into the right eye 4 (four) times daily for 7 days.    Dispense:  5 mL    Refill:  0       Return in about 2 weeks (around 04/08/2021) for 2 wk POV OD w/DFE&OCT.  There are no Patient Instructions on file for this visit.  Explained the diagnoses, plan, and follow up with the patient and they expressed understanding.  Patient expressed understanding of the importance of proper follow up care.  This document serves as a record of services personally performed by Gardiner Sleeper, MD, PhD. It was created on their behalf by San Jetty. Owens Shark, OA an ophthalmic technician. The creation of this record is the provider's dictation and/or activities during the visit.    Electronically signed by: San Jetty. Owens Shark, [place] 09.21.2022 12:56 PM  Gardiner Sleeper, M.D., Ph.D. Diseases & Surgery of the Retina and [place] 9.21.22  I have reviewed the above documentation for accuracy and completeness, and I agree with the above. Gardiner Sleeper, M.D., Ph.D. 03/25/21  12:56 PM   Abbreviations: M myopia (nearsighted); A astigmatism; H hyperopia (farsighted); P presbyopia; Mrx spectacle prescription;  CTL contact lenses; OD right eye; OS left eye; OU both eyes  XT exotropia; ET esotropia; PEK punctate epithelial keratitis; PEE punctate epithelial erosions; DES dry eye syndrome; MGD meibomian gland dysfunction; ATs artificial tears; PFAT's preservative free artificial tears; [place] nuclear sclerotic cataract; PSC posterior subcapsular cataract; ERM epi-retinal membrane; PVD posterior vitreous detachment; RD retinal detachment; DM diabetes mellitus; DR diabetic retinopathy; NPDR non-proliferative diabetic retinopathy; PDR proliferative diabetic retinopathy; CSME clinically significant macular edema; DME diabetic macular edema; dbh dot blot hemorrhages; CWS cotton wool spot; POAG primary open angle glaucoma; C/D cup-to-disc ratio; HVF humphrey visual field; GVF goldmann visual field; OCT optical coherence tomography; IOP intraocular pressure; BRVO Branch retinal vein occlusion; CRVO central retinal vein occlusion; CRAO central retinal artery occlusion; BRAO branch retinal artery occlusion; RT retinal tear; SB scleral buckle; PPV pars plana vitrectomy; VH Vitreous hemorrhage; PRP panretinal laser photocoagulation; IVK intravitreal kenalog; VMT vitreomacular traction; MH Macular hole;  NVD neovascularization of the disc; NVE neovascularization elsewhere; AREDS age related eye disease study; ARMD age related macular degeneration; POAG primary open angle glaucoma; EBMD epithelial/anterior basement membrane dystrophy; ACIOL anterior chamber intraocular lens; IOL intraocular lens; PCIOL posterior chamber intraocular lens; Phaco/IOL phacoemulsification with intraocular lens placement; [place] photorefractive keratectomy; LASIK laser assisted in situ keratomileusis; HTN hypertension; DM diabetes mellitus; COPD chronic obstructive pulmonary disease

## 2021-04-02 NOTE — Progress Notes (Signed)
[facility] Note  04/08/2021     CHIEF COMPLAINT Patient presents for Post-op Follow-up   HISTORY OF PRESENT ILLNESS: Rachel Zimmerman is a 60 y.o. female who presents to the clinic today for:   HPI     Post-op Follow-up   In right eye.  I, the attending physician,  performed the HPI with the patient and updated documentation appropriately.        Comments   2 week follow up Retinal break OD, s/p Retinopexy 03/25/2021.  Vision is a little blurred OD, denies any pain.        Last edited by Bernarda Caffey, MD on 04/12/2021  1:11 AM.     Referring physician: Donald Prose, MD [place],  [place] [id number]  HISTORICAL INFORMATION:   Selected notes from the MEDICAL RECORD NUMBER Referred by Dr. Nicki Reaper for eval of poss HST OD   CURRENT MEDICATIONS: No current outpatient medications on file. (Ophthalmic Drugs)   No current facility-administered medications for this visit. (Ophthalmic Drugs)   Current Outpatient Medications (Other)  Medication Sig   tamoxifen (NOLVADEX) 20 MG tablet Take 1 tablet (20 mg total) by mouth daily.   No current facility-administered medications for this visit. (Other)   REVIEW OF SYSTEMS: ROS   Positive for: Eyes Negative for: Constitutional, Gastrointestinal, Neurological, Skin, Genitourinary, Musculoskeletal, HENT, Endocrine, Cardiovascular, Respiratory, Psychiatric, Allergic/Imm, Heme/Lymph Last edited by Leonie Douglas, COA on 04/08/2021  8:16 AM.     ALLERGIES Allergies  Allergen Reactions   Benadryl [Diphenhydramine Hcl (Sleep)]     Loopy, altered mental state   PAST MEDICAL HISTORY Past Medical History:  Diagnosis Date   Menopause    Vitamin D deficiency    Past Surgical History:  Procedure Laterality Date   APPENDECTOMY     BREAST LUMPECTOMY WITH RADIOACTIVE SEED LOCALIZATION Left 03/04/2017   Procedure: BREAST LUMPECTOMY WITH RADIOACTIVE SEED LOCALIZATION;  Surgeon: Fanny Skates, MD;  Location: [facility];  Service: General;  Laterality: Left;   CESAREAN SECTION     x 2   COLONOSCOPY     DILATATION & CURETTAGE/HYSTEROSCOPY WITH MYOSURE N/A 09/15/2016   Procedure: [place];  Surgeon: Thurnell Lose, MD;  Location: [facility];  Service: Gynecology;  Laterality: N/A;  Ultrasound Guidance Myosure - Fibroids   OPERATIVE ULTRASOUND N/A 09/15/2016   Procedure: OPERATIVE ULTRASOUND;  Surgeon: Thurnell Lose, MD;  Location: [facility];  Service: Gynecology;  Laterality: N/A;   FAMILY HISTORY Family History  Problem Relation Age of Onset   Hypertension Father    Kidney failure Father    Diabetes Father    Stroke Father        Deceased   Hypercholesterolemia Mother    Myasthenia gravis Mother        Living   Breast cancer Maternal Aunt    Healthy Brother    Healthy Son        x 2   SOCIAL HISTORY Social History   Tobacco Use   Smoking status: Never   Smokeless tobacco: Never  Substance Use Topics   Alcohol use: Yes    Alcohol/week: 0.0 standard drinks    Comment: Socially a few times per year   Drug use: No       OPHTHALMIC EXAM:  Base Eye Exam     Visual Acuity (Snellen - Linear)       Right Left   Dist cc  20/25 -2 20/20   Dist ph cc 20/20          Tonometry (Tonopen, 8:21 AM)       Right Left   Pressure 12 13         Pupils       Dark Light Shape React APD   Right 3 2 Round Brisk None   Left 3 2 Round Brisk None         Visual Fields (Counting fingers)       Left Right    Full Full         Extraocular Movement       Right Left    Full Full         Neuro/Psych     Oriented x3: Yes   Mood/Affect: Normal         Dilation     Both eyes: 1.0% Mydriacyl, 2.5% Phenylephrine @ 8:21 AM           Slit Lamp and Fundus Exam     Slit Lamp Exam       Right Left   Lids/Lashes Normal Normal   Conjunctiva/Sclera Mild melanosis Mild melanosis    Cornea Mild arcus Mild arcus   Anterior Chamber Deep and quiet Deep and quiet   Iris Round and dilated Round and dilated   Lens Clear Clear   Vitreous Syneresis Syneresis, PVD, vitreous condensations         Fundus Exam       Right Left   Disc Pink, sharp, mild PPP temporally Pink, sharp   C/D Ratio 0.5 0.5   Macula Flat, good foveal reflex, mild RPE mottling, No heme or edema Flat, good foveal reflex, no heme or edema   Vessels Mild attenuation Mild attenuation, mild Copper wiring, mild tortuousity   Periphery Attached; small patch of lattice w/retinal break at 0730--almost to ORA -- good laser changes surrounding, no SRF;  Focal WWP at 0830--simulates HST but no tears on scleral depression Small strip of pigmented lattice at 0500 almost to ora, otherwise attached, +WWP, no RT/RD on scleral depression           Refraction     Wearing Rx       Sphere Cylinder Axis Add   Right +0.00 +0.50 132 +2.50   Left +0.00 +0.75 145 +2.50    Type: PROG           IMAGING AND PROCEDURES  Imaging and Procedures for 04/08/2021  OCT, Retina - OU - Both Eyes       Right Eye Quality was good. Central Foveal Thickness: 252. Progression has been stable. Findings include normal foveal contour, no IRF, no SRF.   Left Eye Quality was good. Central Foveal Thickness: 264. Progression has been stable. Findings include normal foveal contour, no IRF, no SRF.   Notes *Images captured and stored on drive  Diagnosis / Impression:  OU: NFP, no IRF/SRF  Clinical management:  See below  Abbreviations: NFP - Normal foveal profile. CME - cystoid macular edema. PED - pigment epithelial detachment. IRF - intraretinal fluid. SRF - subretinal fluid. EZ - ellipsoid zone. ERM - epiretinal membrane. ORA - outer retinal atrophy. ORT - outer retinal tubulation. SRHM - subretinal hyper-reflective material. IRHM - intraretinal hyper-reflective material            ASSESSMENT/PLAN:    ICD-10-CM   1.  Retinal edema  H35.81 OCT, Retina - OU - Both Eyes    2. Retinal  breaks without detachment  H35.89     3. 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   - focal patch of lattice w/ retinal break at 730, almost to ora - s/p laser retinopexy OD (09.21.22) -- good laser surrounding - completed Lotemax QID OD x7 days - f/u in 4 months, DFE, OCT  2,3. Lattice degeneration both eyes - small patches of lattice OU -- OD at 0730 w/ +retinal break (see above); OS at 0600 - discussed findings, prognosis, and treatment options including observation - s/p laser retinopexy OD as above - f/u in 4 months, DFE, OCT  Ophthalmic Meds Ordered this visit:  No orders of the defined types were placed in this encounter.    Return in about 4 months (around 08/09/2021) for f/u lattice degeneration OU, DFE, OCT.  There are no Patient Instructions on file for this visit.  This document serves as a record of services personally performed by Gardiner Sleeper, MD, PhD. It was created on their behalf by Orvan Falconer, an ophthalmic technician. The creation of this record is the provider's dictation and/or activities during the visit.    Electronically signed by: Orvan Falconer, OA, 04/12/21  1:15 AM   This document serves as a record of services personally performed by Gardiner Sleeper, MD, PhD. It was created on their behalf by San Jetty. Owens Shark, OA an ophthalmic technician. The creation of this record is the provider's dictation and/or activities during the visit.    Electronically signed by: San Jetty. Owens Shark, [place] 10.05.2022 1:15 AM   Gardiner Sleeper, M.D., Ph.D. Diseases & Surgery of the Retina and Vitreous [facility]  I have reviewed the above documentation for accuracy and completeness, and I agree with the above. Gardiner Sleeper, M.D., Ph.D. 04/12/21 1:16 AM   Abbreviations: M myopia (nearsighted); A astigmatism; H hyperopia (farsighted); P  presbyopia; Mrx spectacle prescription;  CTL contact lenses; OD right eye; OS left eye; OU both eyes  XT exotropia; ET esotropia; PEK punctate epithelial keratitis; PEE punctate epithelial erosions; DES dry eye syndrome; MGD meibomian gland dysfunction; ATs artificial tears; PFAT's preservative free artificial tears; [place] nuclear sclerotic cataract; PSC posterior subcapsular cataract; ERM epi-retinal membrane; PVD posterior vitreous detachment; RD retinal detachment; DM diabetes mellitus; DR diabetic retinopathy; NPDR non-proliferative diabetic retinopathy; PDR proliferative diabetic retinopathy; CSME clinically significant macular edema; DME diabetic macular edema; dbh dot blot hemorrhages; CWS cotton wool spot; POAG primary open angle glaucoma; C/D cup-to-disc ratio; HVF humphrey visual field; GVF goldmann visual field; OCT optical coherence tomography; IOP intraocular pressure; BRVO Branch retinal vein occlusion; CRVO central retinal vein occlusion; CRAO central retinal artery occlusion; BRAO branch retinal artery occlusion; RT retinal tear; SB scleral buckle; PPV pars plana vitrectomy; VH Vitreous hemorrhage; PRP panretinal laser photocoagulation; IVK intravitreal kenalog; VMT vitreomacular traction; MH Macular hole;  NVD neovascularization of the disc; NVE neovascularization elsewhere; AREDS age related eye disease study; ARMD age related macular degeneration; POAG primary open angle glaucoma; EBMD epithelial/anterior basement membrane dystrophy; ACIOL anterior chamber intraocular lens; IOL intraocular lens; PCIOL posterior chamber intraocular lens; Phaco/IOL phacoemulsification with intraocular lens placement; [place] photorefractive keratectomy; LASIK laser assisted in situ keratomileusis; HTN hypertension; DM diabetes mellitus; COPD chronic obstructive pulmonary disease

## 2021-04-08 ENCOUNTER — Other Ambulatory Visit: Payer: Self-pay

## 2021-04-08 ENCOUNTER — Ambulatory Visit (INDEPENDENT_AMBULATORY_CARE_PROVIDER_SITE_OTHER): Payer: BC Managed Care – PPO | Admitting: Ophthalmology

## 2021-04-08 DIAGNOSIS — H35413 Lattice degeneration of retina, bilateral: Secondary | ICD-10-CM | POA: Diagnosis not present

## 2021-04-08 DIAGNOSIS — H3589 Other specified retinal disorders: Secondary | ICD-10-CM | POA: Diagnosis not present

## 2021-04-08 DIAGNOSIS — H3581 Retinal edema: Secondary | ICD-10-CM

## 2021-04-12 ENCOUNTER — Encounter (INDEPENDENT_AMBULATORY_CARE_PROVIDER_SITE_OTHER): Payer: Self-pay | Admitting: Ophthalmology

## 2021-04-30 ENCOUNTER — Ambulatory Visit: Payer: BC Managed Care – PPO | Admitting: Hematology and Oncology

## 2021-05-02 NOTE — Progress Notes (Signed)
Patient Care Team: Donald Prose, MD as PCP - General (Family Medicine) Fanny Skates, MD as Consulting Physician (General Surgery) Nicholas Lose, MD as Consulting Physician (Hematology and Oncology) Kyung Rudd, MD as Consulting Physician (Radiation Oncology) Delice Bison Charlestine Massed, NP as Nurse Practitioner (Hematology and Oncology)  DIAGNOSIS:    ICD-10-CM   1. Ductal carcinoma in situ (DCIS) of left breast  D05.12       SUMMARY OF ONCOLOGIC HISTORY: Oncology History  Ductal carcinoma in situ (DCIS) of left breast  01/17/2017 Initial Diagnosis   Screening detected left breast calcifications 6 mm span ultrasound axilla negative, biopsy high-grade DCIS with calcifications and necrosis plus ALH, ER 95%, PR 70%, Tis N0 stage 0   03/04/2017 Surgery   Left lumpectomy: DCIS with calcifications, high-grade, 1.1 cm,ALH, margins negative, ER 95%, PR 70%, Tis Nx stage 0   04/04/2017 - 05/18/2017 Radiation Therapy   Adjuvant radiation therapy   07/2017 -  Anti-estrogen oral therapy   Tamoxifen daily     CHIEF COMPLIANT: Follow-up of left breast DCIS on tamoxifen therapy  INTERVAL HISTORY: Rachel Zimmerman is a 60 y.o. with above-mentioned history of left breast DCIS who underwent a lumpectomy, radiation, and is currently on tamoxifen. She presents to the clinic today for follow-up.  She is tolerating tamoxifen extremely well without any problems or concerns.  Denies any major hot flashes arm mood swings.  She has been working from home for [employer] and has been exercising regularly in the morning.  ALLERGIES:  is allergic to benadryl [diphenhydramine hcl (sleep)].  MEDICATIONS:  Current Outpatient Medications  Medication Sig Dispense Refill   tamoxifen (NOLVADEX) 20 MG tablet Take 1 tablet (20 mg total) by mouth daily. 90 tablet 3   No current facility-administered medications for this visit.    PHYSICAL EXAMINATION: ECOG PERFORMANCE STATUS: 0 - Asymptomatic  Vitals:   05/04/21  1519  BP: (!) 120/55  Pulse: 72  Resp: 18  Temp: 97.9 F (36.6 C)  SpO2: 100%   Filed Weights   05/04/21 1519  Weight: 181 lb 4.8 oz (82.2 kg)    BREAST: No palpable masses or nodules in either right or left breasts. No palpable axillary supraclavicular or infraclavicular adenopathy no breast tenderness or nipple discharge. (exam performed in the presence of a chaperone)  LABORATORY DATA:  I have reviewed the data as listed CMP Latest Ref Rng & Units 02/02/2017  Glucose 70 - 140 mg/dl 86  BUN 7.0 - 26.0 mg/dL 22.4  Creatinine 0.6 - 1.1 mg/dL 0.9  Sodium 136 - 145 mEq/L 141  Potassium 3.5 - 5.1 mEq/L 3.9  CO2 22 - 29 mEq/L 27  Calcium 8.4 - 10.4 mg/dL 9.9  Total Protein 6.4 - 8.3 g/dL 8.0  Total Bilirubin 0.20 - 1.20 mg/dL 0.87  Alkaline Phos 40 - 150 U/L 67  AST 5 - 34 U/L 19  ALT 0 - 55 U/L 14    Lab Results  Component Value Date   WBC 6.4 02/02/2017   HGB 13.3 02/02/2017   HCT 40.9 02/02/2017   MCV 89.7 02/02/2017   PLT 195 02/02/2017   NEUTROABS 3.5 02/02/2017    ASSESSMENT & PLAN:  Ductal carcinoma in situ (DCIS) of left breast 03/04/2017: Left lumpectomy: DCIS with calcifications, high-grade, 1.1 cm,ALH, margins negative, ER 95%, PR 70%, Tis Nx stage 0   Adjuvant radiation 04/04/2017-05/18/2017 Recommendation: Adjuvant antiestrogen therapy with tamoxifen 20 mg daily for 5 years, started July 05, 2017   Tamoxifen toxicities: Denies any  adverse effects with tamoxifen.  Denies any hot flashes or arthralgias or myalgias.    Breast cancer surveillance: Breast exam 05/04/2021: Benign Mammogram at [facility] July 2022: Benign  Return to clinic in 1 year for follow-up and after that on an as needed basis    No orders of the defined types were placed in this encounter.  The patient has a good understanding of the overall plan. she agrees with it. she will call with any problems that may develop before the next visit here.  Total time spent: 20 mins including face  to face time and time spent for planning, charting and coordination of care  Rulon Eisenmenger, MD, MPH 05/04/2021  I, Thana Ates, am acting as scribe for Dr. Nicholas Lose.  I have reviewed the above documentation for accuracy and completeness, and I agree with the above.

## 2021-05-04 ENCOUNTER — Inpatient Hospital Stay: Payer: BC Managed Care – PPO | Attending: Hematology and Oncology | Admitting: Hematology and Oncology

## 2021-05-04 ENCOUNTER — Other Ambulatory Visit: Payer: Self-pay

## 2021-05-04 DIAGNOSIS — Z79810 Long term (current) use of selective estrogen receptor modulators (SERMs): Secondary | ICD-10-CM | POA: Insufficient documentation

## 2021-05-04 DIAGNOSIS — Z923 Personal history of irradiation: Secondary | ICD-10-CM | POA: Diagnosis not present

## 2021-05-04 DIAGNOSIS — D0512 Intraductal carcinoma in situ of left breast: Secondary | ICD-10-CM | POA: Insufficient documentation

## 2021-05-04 MED ORDER — TAMOXIFEN CITRATE 20 MG PO TABS: 20.0000 mg | ORAL_TABLET | Freq: Every day | ORAL | 3 refills | Status: DC

## 2021-05-04 NOTE — Assessment & Plan Note (Signed)
03/04/2017:Left lumpectomy: DCIS with calcifications, high-grade, 1.1 cm,ALH, margins negative, ER 95%, PR 70%, Tis Nx stage 0  Adjuvant radiation 04/04/2017-05/18/2017 Recommendation: Adjuvant antiestrogen therapy with tamoxifen 20 mg daily for 5 years,startedJanuary 1, 2019  Tamoxifentoxicities: Denies any adverse effects with tamoxifen.  Denies any hot flashes or arthralgias or myalgias.   Patientwas concerned about heavy uterine bleeding as she has had problems with that in the past and required a D&C.  She has no problems with uterine bleeding anymore.   Return to clinic in1 year for follow-up and after that on an as needed basis

## 2021-08-05 NOTE — Progress Notes (Shared)
[facility] Note  08/10/2021     CHIEF COMPLAINT Patient presents for No chief complaint on file.    HISTORY OF PRESENT ILLNESS: Rachel Zimmerman is a 61 y.o. female who presents to the clinic today for:     Referring physician: Donald Prose, MD [place],  [place] [id number]  HISTORICAL INFORMATION:   Selected notes from the MEDICAL RECORD NUMBER Referred by Dr. Nicki Reaper for eval of poss HST OD   CURRENT MEDICATIONS: No current outpatient medications on file. (Ophthalmic Drugs)   No current facility-administered medications for this visit. (Ophthalmic Drugs)   Current Outpatient Medications (Other)  Medication Sig   tamoxifen (NOLVADEX) 20 MG tablet Take 1 tablet (20 mg total) by mouth daily.   No current facility-administered medications for this visit. (Other)   REVIEW OF SYSTEMS:   ALLERGIES Allergies  Allergen Reactions   Benadryl [Diphenhydramine Hcl (Sleep)]     Loopy, altered mental state   PAST MEDICAL HISTORY Past Medical History:  Diagnosis Date   Menopause    Vitamin D deficiency    Past Surgical History:  Procedure Laterality Date   APPENDECTOMY     BREAST LUMPECTOMY WITH RADIOACTIVE SEED LOCALIZATION Left 03/04/2017   Procedure: BREAST LUMPECTOMY WITH RADIOACTIVE SEED LOCALIZATION;  Surgeon: Fanny Skates, MD;  Location: [facility];  Service: General;  Laterality: Left;   CESAREAN SECTION     x 2   COLONOSCOPY     DILATATION & CURETTAGE/HYSTEROSCOPY WITH MYOSURE N/A 09/15/2016   Procedure: [place];  Surgeon: Thurnell Lose, MD;  Location: [facility];  Service: Gynecology;  Laterality: N/A;  Ultrasound Guidance Myosure - Fibroids   OPERATIVE ULTRASOUND N/A 09/15/2016   Procedure: OPERATIVE ULTRASOUND;  Surgeon: Thurnell Lose, MD;  Location: [facility];  Service: Gynecology;  Laterality: N/A;   FAMILY HISTORY Family History  Problem  Relation Age of Onset   Hypertension Father    Kidney failure Father    Diabetes Father    Stroke Father        Deceased   Hypercholesterolemia Mother    Myasthenia gravis Mother        Living   Breast cancer Maternal Aunt    Healthy Brother    Healthy Son        x 2   SOCIAL HISTORY Social History   Tobacco Use   Smoking status: Never   Smokeless tobacco: Never  Substance Use Topics   Alcohol use: Yes    Alcohol/week: 0.0 standard drinks    Comment: Socially a few times per year   Drug use: No       OPHTHALMIC EXAM:  Not recorded    IMAGING AND PROCEDURES  Imaging and Procedures for 08/10/2021          ASSESSMENT/PLAN:    ICD-10-CM   1. Retinal breaks without detachment  H35.89     2. 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   - focal patch of lattice w/ retinal break at 730, almost to ora - s/p laser retinopexy OD (09.21.22) -- good laser surrounding - completed Lotemax QID OD x7 days - f/u in 4 months, DFE, OCT  2. Lattice degeneration both eyes - small patches of lattice OU -- OD at 0730 w/ +retinal break (see above); OS at 0600 - discussed findings, prognosis, and treatment options including observation - s/p laser  retinopexy OD as above - f/u 4 months DFE, OCT  Ophthalmic Meds Ordered this visit:  No orders of the defined types were placed in this encounter.     No follow-ups on file.  There are no Patient Instructions on file for this visit.  This document serves as a record of services personally performed by Gardiner Sleeper, MD, PhD. It was created on their behalf by Roselee Nova, COMT. The creation of this record is the provider's dictation and/or activities during the visit.  Electronically signed by: Roselee Nova, COMT 08/05/21 10:54 AM    Gardiner Sleeper, M.D., Ph.D. Diseases & Surgery of the Retina and Vitreous [facility]: M myopia (nearsighted);  A astigmatism; H hyperopia (farsighted); P presbyopia; Mrx spectacle prescription;  CTL contact lenses; OD right eye; OS left eye; OU both eyes  XT exotropia; ET esotropia; PEK punctate epithelial keratitis; PEE punctate epithelial erosions; DES dry eye syndrome; MGD meibomian gland dysfunction; ATs artificial tears; PFAT's preservative free artificial tears; [place] nuclear sclerotic cataract; PSC posterior subcapsular cataract; ERM epi-retinal membrane; PVD posterior vitreous detachment; RD retinal detachment; DM diabetes mellitus; DR diabetic retinopathy; NPDR non-proliferative diabetic retinopathy; PDR proliferative diabetic retinopathy; CSME clinically significant macular edema; DME diabetic macular edema; dbh dot blot hemorrhages; CWS cotton wool spot; POAG primary open angle glaucoma; C/D cup-to-disc ratio; HVF humphrey visual field; GVF goldmann visual field; OCT optical coherence tomography; IOP intraocular pressure; BRVO Branch retinal vein occlusion; CRVO central retinal vein occlusion; CRAO central retinal artery occlusion; BRAO branch retinal artery occlusion; RT retinal tear; SB scleral buckle; PPV pars plana vitrectomy; VH Vitreous hemorrhage; PRP panretinal laser photocoagulation; IVK intravitreal kenalog; VMT vitreomacular traction; MH Macular hole;  NVD neovascularization of the disc; NVE neovascularization elsewhere; AREDS age related eye disease study; ARMD age related macular degeneration; POAG primary open angle glaucoma; EBMD epithelial/anterior basement membrane dystrophy; ACIOL anterior chamber intraocular lens; IOL intraocular lens; PCIOL posterior chamber intraocular lens; Phaco/IOL phacoemulsification with intraocular lens placement; [place] photorefractive keratectomy; LASIK laser assisted in situ keratomileusis; HTN hypertension; DM diabetes mellitus; COPD chronic obstructive pulmonary disease

## 2021-08-10 ENCOUNTER — Encounter (INDEPENDENT_AMBULATORY_CARE_PROVIDER_SITE_OTHER): Payer: BC Managed Care – PPO | Admitting: Ophthalmology

## 2021-08-10 DIAGNOSIS — H35413 Lattice degeneration of retina, bilateral: Secondary | ICD-10-CM

## 2021-08-10 DIAGNOSIS — H3589 Other specified retinal disorders: Secondary | ICD-10-CM

## 2022-01-29 ENCOUNTER — Other Ambulatory Visit: Payer: Self-pay

## 2022-04-19 ENCOUNTER — Telehealth: Payer: Self-pay | Admitting: Hematology and Oncology

## 2022-04-19 NOTE — Telephone Encounter (Signed)
Rescheduled appointment per provider PAL. Left voicemail. 

## 2022-05-03 ENCOUNTER — Ambulatory Visit: Payer: BC Managed Care – PPO | Admitting: Hematology and Oncology

## 2022-05-03 NOTE — Progress Notes (Signed)
Patient Care Team: Donald Prose, MD as PCP - General (Family Medicine) Fanny Skates, MD as Consulting Physician (General Surgery) Nicholas Lose, MD as Consulting Physician (Hematology and Oncology) Kyung Rudd, MD as Consulting Physician (Radiation Oncology) Delice Bison Charlestine Massed, NP as Nurse Practitioner (Hematology and Oncology)  DIAGNOSIS:  Encounter Diagnosis  Name Primary?   Ductal carcinoma in situ (DCIS) of left breast Yes    SUMMARY OF ONCOLOGIC HISTORY: Oncology History  Ductal carcinoma in situ (DCIS) of left breast  01/17/2017 Initial Diagnosis   Screening detected left breast calcifications 6 mm span ultrasound axilla negative, biopsy high-grade DCIS with calcifications and necrosis plus ALH, ER 95%, PR 70%, Tis N0 stage 0   03/04/2017 Surgery   Left lumpectomy: DCIS with calcifications, high-grade, 1.1 cm,ALH, margins negative, ER 95%, PR 70%, Tis Nx stage 0   04/04/2017 - 05/18/2017 Radiation Therapy   Adjuvant radiation therapy   07/2017 -  Anti-estrogen oral therapy   Tamoxifen daily     CHIEF COMPLIANT:  Follow-up of left breast DCIS on tamoxifen therapy    INTERVAL HISTORY: Rachel Zimmerman is a 61 y.o. with above-mentioned history of left breast DCIS who underwent a lumpectomy, radiation, and is currently on tamoxifen. She presents to the clinic today for follow-up.  She has completed 5 years of tamoxifen therapy and is here today to discuss the treatment plan.  Unfortunately she lost her job in January 2023.  She has still not found a job and she is applying for multiple job openings.  This has been causing her significant stress.   ALLERGIES:  is allergic to benadryl [diphenhydramine hcl (sleep)].    PHYSICAL EXAMINATION: ECOG PERFORMANCE STATUS: 1 - Symptomatic but completely ambulatory  Vitals:   05/10/22 1504  BP: 135/78  Pulse: 71  Resp: 18  Temp: 97.8 F (36.6 C)  SpO2: 100%   Filed Weights   05/10/22 1504  Weight: 186 lb 12.8 oz  (84.7 kg)      LABORATORY DATA:  I have reviewed the data as listed    Latest Ref Rng & Units 02/02/2017   12:23 PM  CMP  Glucose 70 - 140 mg/dl 86   BUN 7.0 - 26.0 mg/dL 22.4   Creatinine 0.6 - 1.1 mg/dL 0.9   Sodium 136 - 145 mEq/L 141   Potassium 3.5 - 5.1 mEq/L 3.9   CO2 22 - 29 mEq/L 27   Calcium 8.4 - 10.4 mg/dL 9.9   Total Protein 6.4 - 8.3 g/dL 8.0   Total Bilirubin 0.20 - 1.20 mg/dL 0.87   Alkaline Phos 40 - 150 U/L 67   AST 5 - 34 U/L 19   ALT 0 - 55 U/L 14     Lab Results  Component Value Date   WBC 6.4 02/02/2017   HGB 13.3 02/02/2017   HCT 40.9 02/02/2017   MCV 89.7 02/02/2017   PLT 195 02/02/2017   NEUTROABS 3.5 02/02/2017    ASSESSMENT & PLAN:  Ductal carcinoma in situ (DCIS) of left breast 03/04/2017: Left lumpectomy: DCIS with calcifications, high-grade, 1.1 cm,ALH, margins negative, ER 95%, PR 70%, Tis Nx stage 0   Adjuvant radiation 04/04/2017-05/18/2017 Recommendation: Adjuvant antiestrogen therapy with tamoxifen 20 mg daily for 5 years, started July 05, 2017 with this she completes 5 years of antiestrogen therapy with tamoxifen.  She will discontinue it at this time.   Tamoxifen toxicities: Denies any adverse effects with tamoxifen.  Denies any hot flashes or arthralgias or myalgias.  Breast cancer surveillance: Mammogram at [facility] July 2023: Benign  Job loss: Patient has been searching hard to find appropriate job but she has been unable to find one Return to clinic on an as-needed basis.    No orders of the defined types were placed in this encounter.  The patient has a good understanding of the overall plan. she agrees with it. she will call with any problems that may develop before the next visit here. Total time spent: 20 mins including face to face time and time spent for planning, charting and co-ordination of care   Harriette Ohara, MD 05/10/22    I Gardiner Coins am scribing for Dr. Lindi Adie  I have reviewed the above  documentation for accuracy and completeness, and I agree with the above.

## 2022-05-10 ENCOUNTER — Inpatient Hospital Stay: Payer: Commercial Managed Care - HMO | Attending: Hematology and Oncology | Admitting: Hematology and Oncology

## 2022-05-10 ENCOUNTER — Other Ambulatory Visit: Payer: Self-pay

## 2022-05-10 VITALS — BP 135/78 | HR 71 | Temp 97.8°F | Resp 18 | Ht 67.0 in | Wt 186.8 lb

## 2022-05-10 DIAGNOSIS — D0512 Intraductal carcinoma in situ of left breast: Secondary | ICD-10-CM | POA: Insufficient documentation

## 2022-05-10 DIAGNOSIS — Z79810 Long term (current) use of selective estrogen receptor modulators (SERMs): Secondary | ICD-10-CM | POA: Diagnosis not present

## 2022-05-10 NOTE — Assessment & Plan Note (Signed)
03/04/2017: Left lumpectomy: DCIS with calcifications, high-grade, 1.1 cm,ALH, margins negative, ER 95%, PR 70%, Tis Nx stage 0   Adjuvant radiation 04/04/2017-05/18/2017 Recommendation: Adjuvant antiestrogen therapy with tamoxifen 20 mg daily for 5 years, started July 05, 2017   Tamoxifen toxicities: Denies any adverse effects with tamoxifen.  Denies any hot flashes or arthralgias or myalgias.    Breast cancer surveillance: Breast exam 05/04/2021: Benign Mammogram at [facility] July 2022: Benign  Return to clinic in 1 year for follow-up and after that on an as needed basis

## 2024-01-31 ENCOUNTER — Encounter: Payer: Self-pay | Admitting: Family Medicine

## 2024-02-02 ENCOUNTER — Ambulatory Visit: Payer: Self-pay

## 2024-02-02 VITALS — Ht 67.0 in | Wt 187.0 lb

## 2024-02-02 DIAGNOSIS — Z1239 Encounter for other screening for malignant neoplasm of breast: Secondary | ICD-10-CM

## 2024-02-02 NOTE — Patient Instructions (Signed)
 Explained breast self awareness with Darice MARLA Brewer. Patient did not need a Pap smear today due to last Pap smear and HPV typing was 11/22/2022. Let her know [facility] will cover Pap smears and HPV typing every 5 years. Referred patient to the [facility] for a diagnostic mammogram per recommendation. Appointment scheduled Tuesday, February 07, 2024 at 0800. Patient aware of appointment and will be there. Darice MARLA Brewer verbalized understanding.  Loren Sawaya, Wanda Ship, RN 1:53 PM

## 2024-02-02 NOTE — Progress Notes (Signed)
 Ms. VERNESSA LIKES is a 63 y.o. female who presents to [facility] clinic today with no complaints. Patient referred to [facility] due to having a screening mammogram completed 01/30/2024 that additional imaging of the right breast is recommended for follow up.   Pap Smear: Pap smear not completed today. Last Pap smear was 11/22/2022 at [facility] clinic and was normal with negative HPV. Per patient has no history of an abnormal Pap smear. Last Pap smear result is available in Epic.   Physical exam: Breasts Breasts symmetrical. No skin abnormalities bilateral breasts. No nipple retraction bilateral breasts. No nipple discharge bilateral breasts. No lymphadenopathy. No lumps palpated bilateral breasts. No complaints of pain or tenderness on exam.       Pelvic/Bimanual Pap is not indicated today per [facility] guidelines.   Smoking History: Patient has never smoked.   Patient Navigation: Patient education provided. Access to services provided for patient through [facility] program.   Colorectal Cancer Screening: Per patient had a colonoscopy completed 6-7 years ago. Patient refused a FIT Test today. No complaints today.    Breast and Cervical Cancer Risk Assessment: Patient has family history of a paternal aunt having breast cancer. Patient has no known genetic mutations or history of radiation treatment to the chest before age 56. Patient does not have history of cervical dysplasia, immunocompromised, or DES exposure in-utero.  Risk Scores as of Encounter on 02/02/2024     Alisa           5-year 1.18%   Lifetime 5.69%            Last calculated by Silas, Ansyi K, CMA on 02/02/2024 at  1:24 PM        A: [facility] exam without pap smear No complaints.  P: Referred patient to the [facility] for a diagnostic mammogram per recommendation. Appointment scheduled Tuesday, February 07, 2024 at 0800.  Driscilla Wanda SQUIBB, RN 02/02/2024 1:53 PM
